# Patient Record
Sex: Female | Born: 1950 | Race: Black or African American | Hispanic: No | Marital: Single | Smoking: Never smoker
Health system: Southern US, Community
[De-identification: ages and names within clinical notes are randomized; demographics above are authoritative.]

## PROBLEM LIST (undated history)

## (undated) DIAGNOSIS — T7840XA Allergy, unspecified, initial encounter: Secondary | ICD-10-CM

## (undated) DIAGNOSIS — I10 Essential (primary) hypertension: Secondary | ICD-10-CM

## (undated) DIAGNOSIS — N879 Dysplasia of cervix uteri, unspecified: Secondary | ICD-10-CM

## (undated) DIAGNOSIS — M810 Age-related osteoporosis without current pathological fracture: Secondary | ICD-10-CM

## (undated) DIAGNOSIS — T783XXA Angioneurotic edema, initial encounter: Secondary | ICD-10-CM

## (undated) DIAGNOSIS — L309 Dermatitis, unspecified: Secondary | ICD-10-CM

## (undated) HISTORY — PX: TUBAL LIGATION: SHX77

## (undated) HISTORY — DX: Allergy, unspecified, initial encounter: T78.40XA

## (undated) HISTORY — PX: COLPOSCOPY: SHX161

## (undated) HISTORY — DX: Essential (primary) hypertension: I10

## (undated) HISTORY — DX: Dermatitis, unspecified: L30.9

## (undated) HISTORY — DX: Angioneurotic edema, initial encounter: T78.3XXA

## (undated) HISTORY — PX: GYNECOLOGIC CRYOSURGERY: SHX857

## (undated) HISTORY — DX: Dysplasia of cervix uteri, unspecified: N87.9

## (undated) HISTORY — PX: BREAST BIOPSY: SHX20

## (undated) HISTORY — DX: Age-related osteoporosis without current pathological fracture: M81.0

---

## 1999-06-01 ENCOUNTER — Other Ambulatory Visit: Admission: RE | Admit: 1999-06-01 | Discharge: 1999-06-01 | Payer: Self-pay | Admitting: Obstetrics and Gynecology

## 1999-08-17 ENCOUNTER — Other Ambulatory Visit: Admission: RE | Admit: 1999-08-17 | Discharge: 1999-08-17 | Payer: Self-pay | Admitting: Gastroenterology

## 1999-08-17 ENCOUNTER — Encounter (INDEPENDENT_AMBULATORY_CARE_PROVIDER_SITE_OTHER): Payer: Self-pay | Admitting: Specialist

## 1999-09-14 ENCOUNTER — Other Ambulatory Visit: Admission: RE | Admit: 1999-09-14 | Discharge: 1999-09-14 | Payer: Self-pay | Admitting: Urology

## 2000-05-27 ENCOUNTER — Other Ambulatory Visit: Admission: RE | Admit: 2000-05-27 | Discharge: 2000-05-27 | Payer: Self-pay | Admitting: Obstetrics and Gynecology

## 2001-05-29 ENCOUNTER — Other Ambulatory Visit: Admission: RE | Admit: 2001-05-29 | Discharge: 2001-05-29 | Payer: Self-pay | Admitting: Obstetrics and Gynecology

## 2002-03-08 ENCOUNTER — Emergency Department (HOSPITAL_COMMUNITY): Admission: EM | Admit: 2002-03-08 | Discharge: 2002-03-08 | Payer: Self-pay | Admitting: Emergency Medicine

## 2002-03-08 ENCOUNTER — Encounter: Payer: Self-pay | Admitting: Emergency Medicine

## 2002-03-15 ENCOUNTER — Encounter: Admission: RE | Admit: 2002-03-15 | Discharge: 2002-03-29 | Payer: Self-pay | Admitting: Family Medicine

## 2002-05-28 ENCOUNTER — Other Ambulatory Visit: Admission: RE | Admit: 2002-05-28 | Discharge: 2002-05-28 | Payer: Self-pay | Admitting: Obstetrics and Gynecology

## 2003-05-31 ENCOUNTER — Other Ambulatory Visit: Admission: RE | Admit: 2003-05-31 | Discharge: 2003-05-31 | Payer: Self-pay | Admitting: Obstetrics and Gynecology

## 2004-06-01 ENCOUNTER — Other Ambulatory Visit: Admission: RE | Admit: 2004-06-01 | Discharge: 2004-06-01 | Payer: Self-pay | Admitting: Obstetrics and Gynecology

## 2005-06-07 ENCOUNTER — Other Ambulatory Visit: Admission: RE | Admit: 2005-06-07 | Discharge: 2005-06-07 | Payer: Self-pay | Admitting: Obstetrics and Gynecology

## 2006-06-13 ENCOUNTER — Other Ambulatory Visit: Admission: RE | Admit: 2006-06-13 | Discharge: 2006-06-13 | Payer: Self-pay | Admitting: Obstetrics and Gynecology

## 2007-01-16 ENCOUNTER — Ambulatory Visit: Payer: Self-pay | Admitting: Gastroenterology

## 2007-01-29 ENCOUNTER — Ambulatory Visit: Payer: Self-pay | Admitting: Gastroenterology

## 2007-04-24 ENCOUNTER — Encounter: Admission: RE | Admit: 2007-04-24 | Discharge: 2007-04-24 | Payer: Self-pay | Admitting: Family Medicine

## 2007-06-19 ENCOUNTER — Other Ambulatory Visit: Admission: RE | Admit: 2007-06-19 | Discharge: 2007-06-19 | Payer: Self-pay | Admitting: Obstetrics and Gynecology

## 2008-07-01 ENCOUNTER — Other Ambulatory Visit: Admission: RE | Admit: 2008-07-01 | Discharge: 2008-07-01 | Payer: Self-pay | Admitting: Obstetrics and Gynecology

## 2008-07-01 ENCOUNTER — Ambulatory Visit: Payer: Self-pay | Admitting: Obstetrics and Gynecology

## 2008-07-01 ENCOUNTER — Encounter: Payer: Self-pay | Admitting: Obstetrics and Gynecology

## 2008-10-22 IMAGING — CR DG CHEST 2V
2 series · 2 of 2 positions shown · non-contrast
Comparison: none

CLINICAL DATA: Cough and congestion

CHEST - 2 VIEW:

[w chest pa]
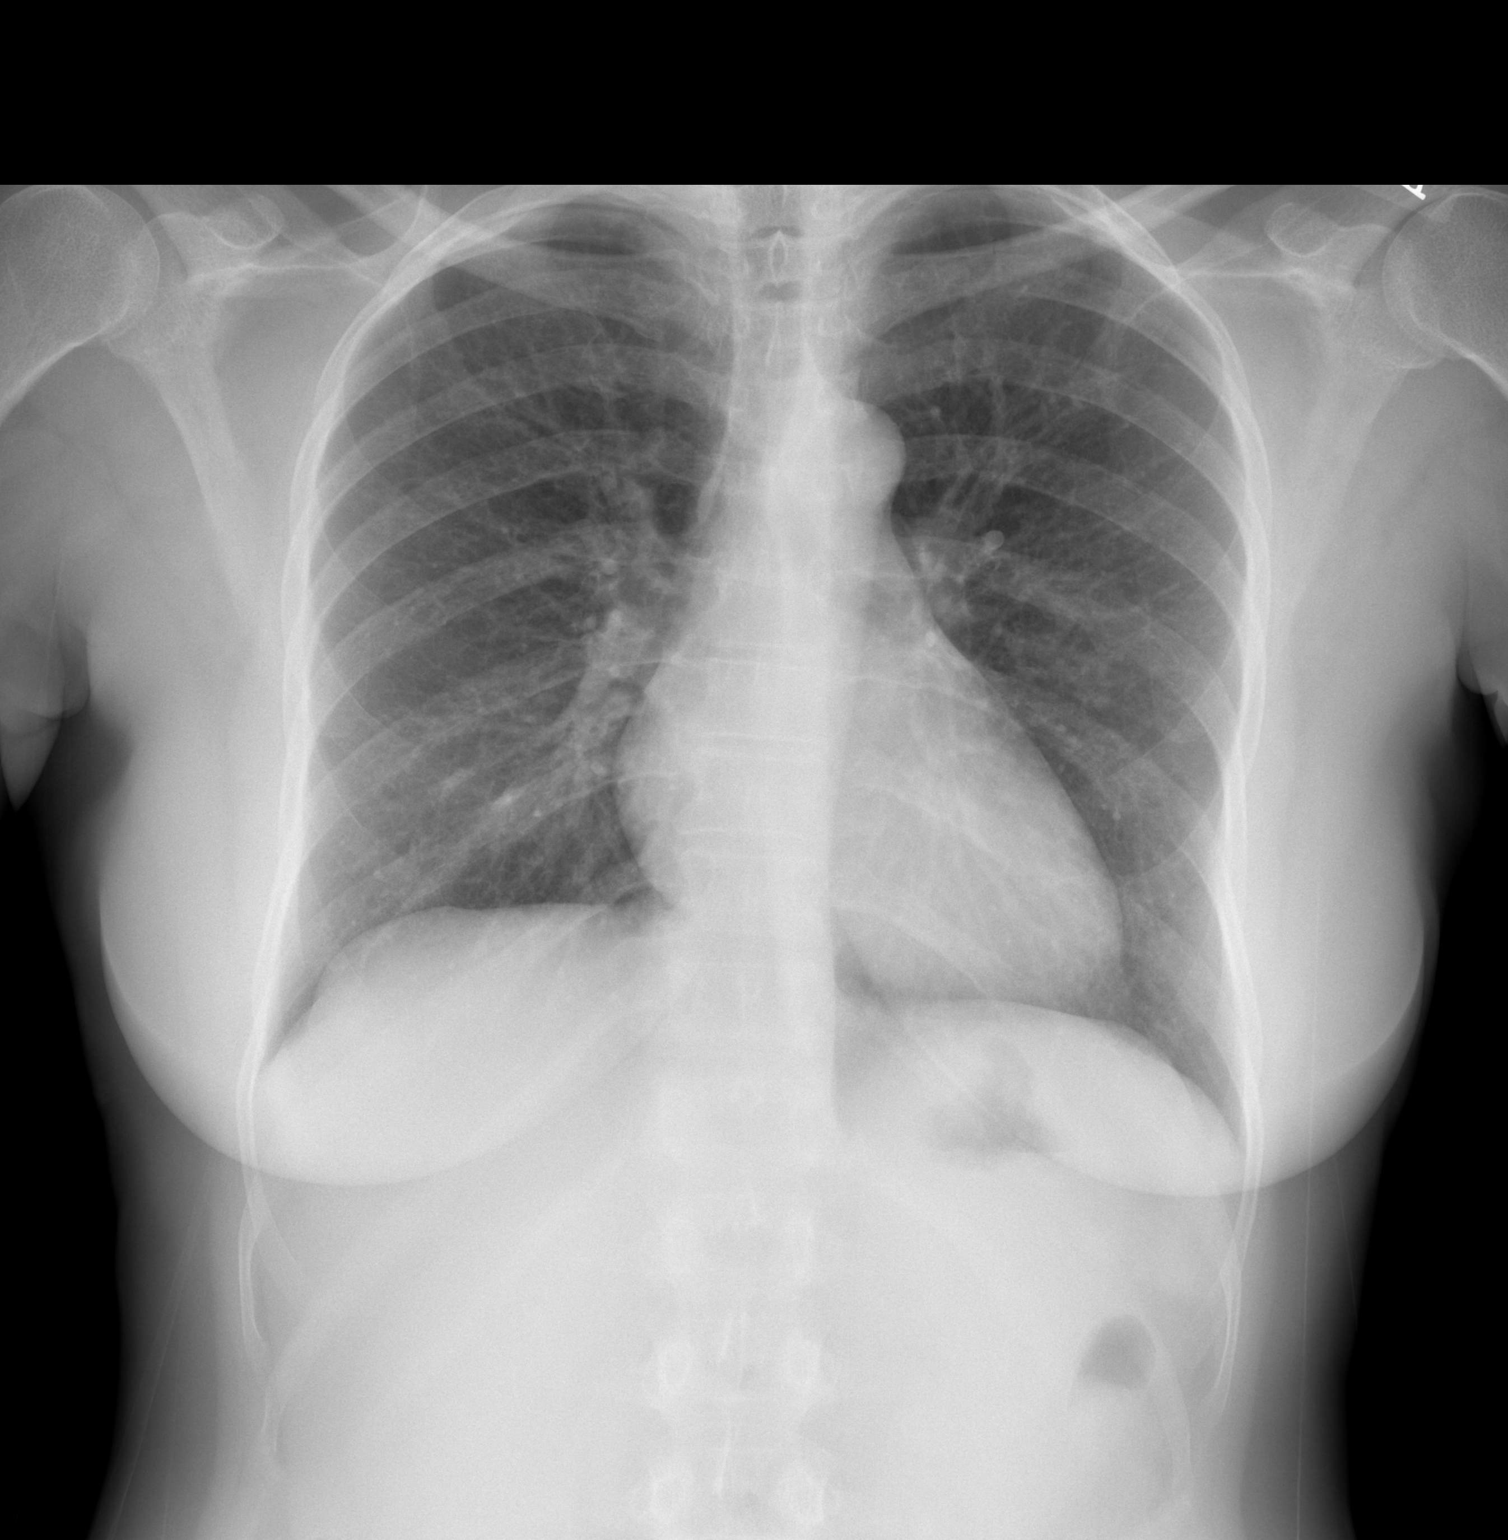

[w chest lat]
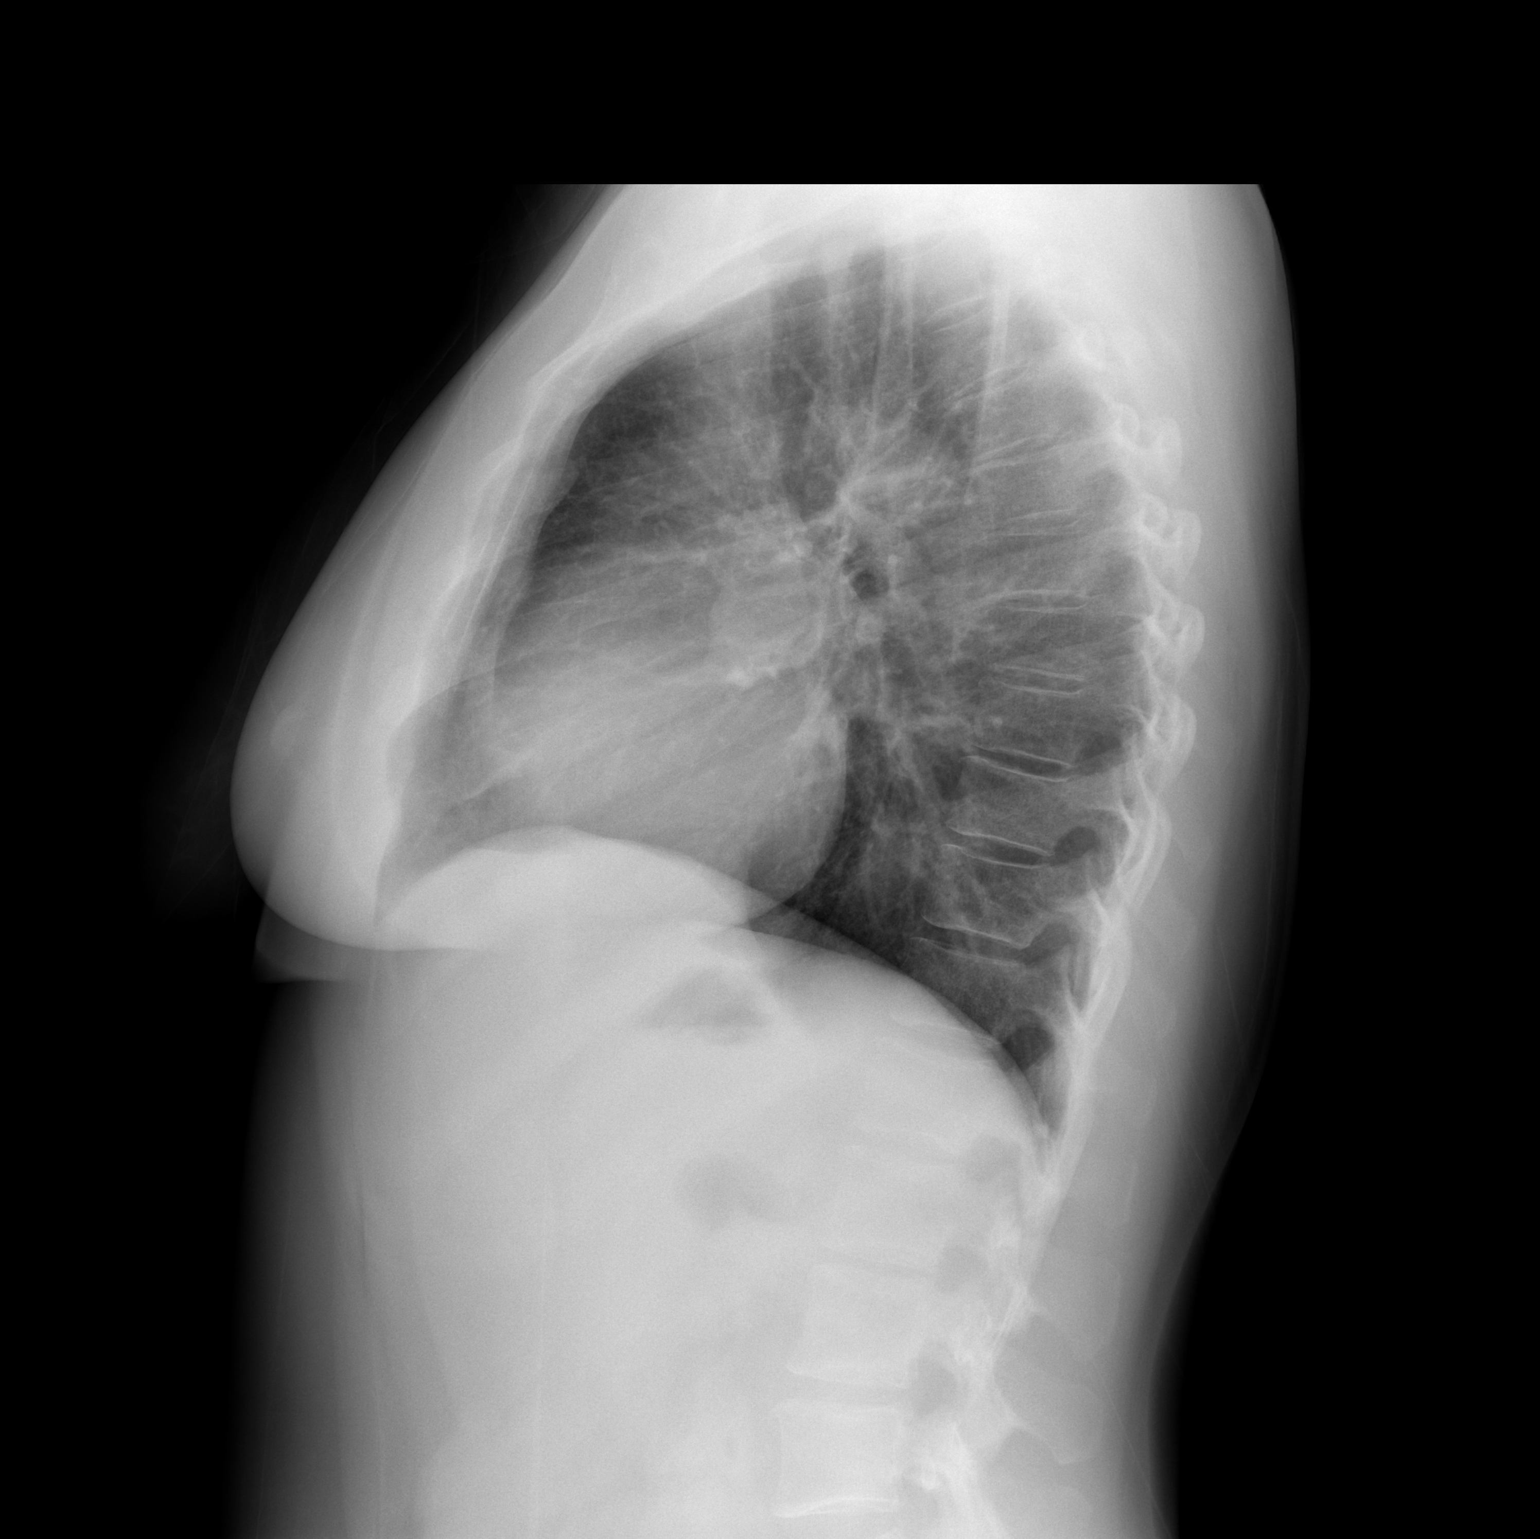

[2 of 2 positions shown; findings below may reference images not displayed]

FINDINGS: The heart size and mediastinal contours are within normal limits.

Both lungs are clear.  

The visualized skeletal structures are unremarkable.
IMPRESSION: No active cardiopulmonary disease.

## 2009-07-11 ENCOUNTER — Encounter: Payer: Self-pay | Admitting: Obstetrics and Gynecology

## 2009-07-11 ENCOUNTER — Other Ambulatory Visit: Admission: RE | Admit: 2009-07-11 | Discharge: 2009-07-11 | Payer: Self-pay | Admitting: Obstetrics and Gynecology

## 2009-07-11 ENCOUNTER — Ambulatory Visit: Payer: Self-pay | Admitting: Obstetrics and Gynecology

## 2010-06-29 ENCOUNTER — Ambulatory Visit: Payer: Self-pay | Admitting: Obstetrics and Gynecology

## 2010-06-29 ENCOUNTER — Other Ambulatory Visit: Admission: RE | Admit: 2010-06-29 | Discharge: 2010-06-29 | Payer: Self-pay | Admitting: Obstetrics and Gynecology

## 2011-01-15 NOTE — Assessment & Plan Note (Signed)
[facility]                                 ON-CALL NOTE   NAME:REIDPoetry, Cerro                        MRN:          [id number]  DATE:01/29/2007                            DOB:          [birth year]    TIME:  2020 hours.   Ms. Armenteros daughter-in-law called the answering service and I returned  her call. Ms. Monrreal had a colonoscopy with hot biopsy of 3 small polyps  today, this morning. This is the third time that she has had a  colonoscopy. Dr. Corinda Gubler performed the procedure. She has no pain. She  has passed gas okay. However, she has been nauseous when she tries to  eat and she has vomited soup that she ate. She got sweaty when she  vomited, but otherwise, she feels okay at this time. I advised her to  sip some clear liquids for a while, and gradually try to advance her  diet, but that if she did not feel better within a few hours, or was  still having problems, she was to call me back. There is no bleeding.  There is no fever and she is not tender in her abdomen when she palpates  it, and she says that she has absolutely no pain.   Perhaps she has had some post sedation problems. There does not appear  to be any sign of a perforation or postpolypectomy syndrome at this  point without fever or abdominal pain. Further plans pending clinical  course, again she knows to call me back and I said that if she had any  severe problems that she should just go to the emergency room.     Iva Boop, MD,FACG  Electronically Signed    CEG/MedQ  DD: 01/29/2007  DT: 01/30/2007  Job #: [id number]   cc:   Ulyess Mort, MD

## 2011-08-23 ENCOUNTER — Encounter: Payer: Self-pay | Admitting: Obstetrics and Gynecology

## 2011-09-20 ENCOUNTER — Ambulatory Visit (INDEPENDENT_AMBULATORY_CARE_PROVIDER_SITE_OTHER): Payer: BC Managed Care – PPO | Admitting: Obstetrics and Gynecology

## 2011-09-20 ENCOUNTER — Other Ambulatory Visit (HOSPITAL_COMMUNITY)
Admission: RE | Admit: 2011-09-20 | Discharge: 2011-09-20 | Disposition: A | Discharge status: Home / Self Care | Payer: BC Managed Care – PPO | Source: Ambulatory Visit | Attending: Obstetrics and Gynecology | Admitting: Obstetrics and Gynecology

## 2011-09-20 ENCOUNTER — Encounter: Payer: Self-pay | Admitting: Obstetrics and Gynecology

## 2011-09-20 VITALS — BP 120/76 | Ht 62.0 in | Wt 137.0 lb

## 2011-09-20 DIAGNOSIS — Z01419 Encounter for gynecological examination (general) (routine) without abnormal findings: Secondary | ICD-10-CM

## 2011-09-20 DIAGNOSIS — I10 Essential (primary) hypertension: Secondary | ICD-10-CM | POA: Insufficient documentation

## 2011-09-20 DIAGNOSIS — N879 Dysplasia of cervix uteri, unspecified: Secondary | ICD-10-CM | POA: Insufficient documentation

## 2011-09-20 LAB — URINALYSIS W MICROSCOPIC + REFLEX CULTURE
Bilirubin Urine: NEGATIVE
Specific Gravity, Urine: 1.02 (ref 1.005–1.030)
Urobilinogen, UA: 0.2 mg/dL (ref 0.0–1.0)

## 2011-09-20 NOTE — Progress Notes (Signed)
Patient came to see me today for her annual GYN exam. She continues to have hot flashes. She has tried both estrogen and SSRI and didn't like either. She is heard of something natural that  she was willing to try but didn't bring the name of the today. She is due for her mammogram and  her bone density. She does have osteopenia without an elevated FRAX risk. She takes calcium and vitamin D. She has had no fractures. She is having no vaginal bleeding or pelvic pain.  Physical examination: Rachel Duffy present. HEENT within normal limits. Neck: Thyroid not large. No masses. Supraclavicular nodes: not enlarged. Breasts: Examined in both sitting midline position. No skin changes and no masses. Abdomen: Soft no guarding rebound or masses or hernia. Pelvic: External: Within normal limits. BUS: Within normal limits. Vaginal:within normal limits. Good estrogen effect. No evidence of cystocele rectocele or enterocele. Cervix: clean. Uterus: Normal size and shape. Adnexa: No masses. Rectovaginal exam: Confirmatory and negative. Extremities: Within normal limits.  Assessment: #1. Menopausal symptoms #2. Osteopenia  Plan: Mammogram in bone density. Return fasting for lab work. Discussed Neurontin for hot flashes. Patient declined. She will get me the name of the medication that she would like to try.

## 2011-09-27 ENCOUNTER — Other Ambulatory Visit: Payer: BC Managed Care – PPO

## 2011-09-27 DIAGNOSIS — Z01419 Encounter for gynecological examination (general) (routine) without abnormal findings: Secondary | ICD-10-CM

## 2011-09-27 LAB — COMPREHENSIVE METABOLIC PANEL
ALT: 23 U/L (ref 0–35)
AST: 22 U/L (ref 0–37)
Albumin: 4.5 g/dL (ref 3.5–5.2)
CO2: 31 mEq/L (ref 19–32)
Calcium: 9.3 mg/dL (ref 8.4–10.5)
Chloride: 104 mEq/L (ref 96–112)
Creat: 0.68 mg/dL (ref 0.50–1.10)
Potassium: 3.7 mEq/L (ref 3.5–5.3)
Sodium: 143 mEq/L (ref 135–145)
Total Protein: 6.6 g/dL (ref 6.0–8.3)

## 2011-09-27 LAB — CBC WITH DIFFERENTIAL/PLATELET
Basophils Absolute: 0 10*3/uL (ref 0.0–0.1)
Eosinophils Absolute: 0.2 10*3/uL (ref 0.0–0.7)
Lymphs Abs: 2.9 10*3/uL (ref 0.7–4.0)
MCH: 32.7 pg (ref 26.0–34.0)
Neutrophils Relative %: 33 % — ABNORMAL LOW (ref 43–77)
Platelets: 254 10*3/uL (ref 150–400)
RBC: 4.41 MIL/uL (ref 3.87–5.11)
RDW: 14.3 % (ref 11.5–15.5)
WBC: 5.1 10*3/uL (ref 4.0–10.5)

## 2011-09-27 LAB — LIPID PANEL
LDL Cholesterol: 87 mg/dL (ref 0–99)
Triglycerides: 86 mg/dL (ref <150)
VLDL: 17 mg/dL (ref 0–40)

## 2011-09-27 LAB — HEMOGLOBIN A1C: Hgb A1c MFr Bld: 5.8 % — ABNORMAL HIGH (ref <5.7)

## 2011-10-02 ENCOUNTER — Other Ambulatory Visit: Payer: Self-pay | Admitting: *Deleted

## 2011-10-02 DIAGNOSIS — M8580 Other specified disorders of bone density and structure, unspecified site: Secondary | ICD-10-CM

## 2011-10-02 DIAGNOSIS — M898X9 Other specified disorders of bone, unspecified site: Secondary | ICD-10-CM

## 2011-10-11 ENCOUNTER — Encounter: Payer: Self-pay | Admitting: Obstetrics and Gynecology

## 2011-10-17 ENCOUNTER — Other Ambulatory Visit: Payer: Self-pay | Admitting: Obstetrics and Gynecology

## 2011-10-17 DIAGNOSIS — M8580 Other specified disorders of bone density and structure, unspecified site: Secondary | ICD-10-CM

## 2011-10-17 DIAGNOSIS — M898X9 Other specified disorders of bone, unspecified site: Secondary | ICD-10-CM

## 2011-11-12 ENCOUNTER — Encounter: Payer: Self-pay | Admitting: Gastroenterology

## 2011-11-13 ENCOUNTER — Encounter: Payer: Self-pay | Admitting: Gastroenterology

## 2012-01-16 ENCOUNTER — Encounter: Payer: BC Managed Care – PPO | Admitting: Gastroenterology

## 2012-01-31 ENCOUNTER — Encounter: Payer: Self-pay | Admitting: Gastroenterology

## 2012-01-31 ENCOUNTER — Ambulatory Visit (AMBULATORY_SURGERY_CENTER): Payer: BC Managed Care – PPO | Admitting: *Deleted

## 2012-01-31 VITALS — Ht 63.0 in | Wt 140.0 lb

## 2012-01-31 DIAGNOSIS — Z1211 Encounter for screening for malignant neoplasm of colon: Secondary | ICD-10-CM

## 2012-01-31 MED ORDER — PEG-KCL-NACL-NASULF-NA ASC-C 100 G PO SOLR: ORAL | Status: DC

## 2012-02-14 ENCOUNTER — Encounter: Payer: Self-pay | Admitting: Gastroenterology

## 2012-02-14 ENCOUNTER — Ambulatory Visit (AMBULATORY_SURGERY_CENTER): Payer: BC Managed Care – PPO | Admitting: Gastroenterology

## 2012-02-14 VITALS — BP 163/71 | HR 59 | Temp 97.1°F | Resp 16 | Ht 63.0 in | Wt 140.0 lb

## 2012-02-14 DIAGNOSIS — D126 Benign neoplasm of colon, unspecified: Secondary | ICD-10-CM

## 2012-02-14 DIAGNOSIS — Z1211 Encounter for screening for malignant neoplasm of colon: Secondary | ICD-10-CM

## 2012-02-14 DIAGNOSIS — Z800 Family history of malignant neoplasm of digestive organs: Secondary | ICD-10-CM

## 2012-02-14 MED ORDER — SODIUM CHLORIDE 0.9 % IV SOLN: 500.0000 mL | INTRAVENOUS | Status: DC

## 2012-02-14 NOTE — Patient Instructions (Addendum)
4 polyps removed and sent to pathology  Otherwise normal exam  YOU HAD AN ENDOSCOPIC PROCEDURE TODAY AT THE [facility]: Refer to the procedure report that was given to you for any specific questions about what was found during the examination.  If the procedure report does not answer your questions, please call your gastroenterologist to clarify.  If you requested that your care partner not be given the details of your procedure findings, then the procedure report has been included in a sealed envelope for you to review at your convenience later.  YOU SHOULD EXPECT: Some feelings of bloating in the abdomen. Passage of more gas than usual.  Walking can help get rid of the air that was put into your GI tract during the procedure and reduce the bloating. If you had a lower endoscopy (such as a colonoscopy or flexible sigmoidoscopy) you may notice spotting of blood in your stool or on the toilet paper. If you underwent a bowel prep for your procedure, then you may not have a normal bowel movement for a few days.  DIET: Your first meal following the procedure should be a light meal and then it is ok to progress to your normal diet.  A half-sandwich or bowl of soup is an example of a good first meal.  Heavy or fried foods are harder to digest and may make you feel nauseous or bloated.  Likewise meals heavy in dairy and vegetables can cause extra gas to form and this can also increase the bloating.  Drink plenty of fluids but you should avoid alcoholic beverages for 24 hours.  ACTIVITY: Your care partner should take you home directly after the procedure.  You should plan to take it easy, moving slowly for the rest of the day.  You can resume normal activity the day after the procedure however you should NOT DRIVE or use heavy machinery for 24 hours (because of the sedation medicines used during the test).    SYMPTOMS TO REPORT IMMEDIATELY: A gastroenterologist can be reached at any hour.   During normal business hours, 8:30 AM to 5:00 PM Monday through Friday, call [phone].  After hours and on weekends, please call the GI answering service at [phone] who will take a message and have the physician on call contact you.   Following lower endoscopy (colonoscopy or flexible sigmoidoscopy):  Excessive amounts of blood in the stool  Significant tenderness or worsening of abdominal pains  Swelling of the abdomen that is new, acute  Fever of 100F or higher  FOLLOW UP: If any biopsies were taken you will be contacted by phone or by letter within the next 1-3 weeks.  Call your gastroenterologist if you have not heard about the biopsies in 3 weeks.  Our staff will call the home number listed on your records the next business day following your procedure to check on you and address any questions or concerns that you may have at that time regarding the information given to you following your procedure. This is a courtesy call and so if there is no answer at the home number and we have not heard from you through the emergency physician on call, we will assume that you have returned to your regular daily activities without incident.  SIGNATURES/CONFIDENTIALITY: You and/or your care partner have signed paperwork which will be entered into your electronic medical record.  These signatures attest to the fact that that the information above on your After Visit Summary has  been reviewed and is understood.  Full responsibility of the confidentiality of this discharge information lies with you and/or your care-partner.  

## 2012-02-14 NOTE — Progress Notes (Signed)
Patient did not experience any of the following events: a burn prior to discharge; a fall within the facility; wrong site/side/patient/procedure/implant event; or a hospital transfer or hospital admission upon discharge from the facility. (G8907) Patient did not have preoperative order for IV antibiotic SSI prophylaxis. (G8918)  

## 2012-02-14 NOTE — Op Note (Addendum)
[facility] [address] [place], [place]  [address]  COLONOSCOPY PROCEDURE REPORT  PATIENT:  Rachel Duffy, Rachel Duffy  MR#:  [id number] BIRTHDATE:  May 28, 1951, 60 yrs. old  GENDER:  female ENDOSCOPIST:  Barbette Hair. Arlyce Dice, MD REF. BY:  Clyda Greener, M.D. PROCEDURE DATE:  02/14/2012 PROCEDURE:  Colonoscopy with snare polypectomy, Colon with cold biopsy polypectomy ASA CLASS:  Class I INDICATIONS:  Elevated Risk Screening, family history of colon cancer Mother MEDICATIONS:   MAC sedation, administered by CRNA propofol 180mg IV  DESCRIPTION OF PROCEDURE:   After the risks benefits and alternatives of the procedure were thoroughly explained, informed consent was obtained.  Digital rectal exam was performed and revealed no abnormalities.   The LB CF-H180AL [id number] endoscope was introduced through the anus and advanced to the cecum, which was identified by the ileocecal valve, without limitations.  The quality of the prep was excellent, using MoviPrep.  The instrument was then slowly withdrawn as the colon was fully examined. <<PROCEDUREIMAGES>>  FINDINGS:  There were multiple polyps identified and removed. Single cecal, 2 transverse and 1 descending colon diminutive polyps measuring about 2mm, removed with cold bx forceps. A 3mm transverse colon polyp was removed with cold polypectomy snare and cold biopsy (see image2, image3, image4, and image5).  This was otherwise a normal examination of the colon (see image1 and image7).   Retroflexed views in the rectum revealed no abnormalities.    The time to cecum =  1) 2.50  minutes. The scope was then withdrawn in  1) 10.50  minutes from the cecum and the procedure completed. COMPLICATIONS:  None ENDOSCOPIC IMPRESSION: 1) Polyps, multiple 2) Otherwise normal examination RECOMMENDATIONS: 1) If the polyp(s) removed today are proven to be adenomatous (pre-cancerous) polyps, you will need a repeat colonoscopy in 5 years. Otherwise you should  continue to follow colorectal cancer screening guidelines for "routine risk" patients with colonoscopy in 10 years. You will receive a letter within 1-2 weeks with the results of your biopsy as well as final recommendations. Please call my office if you have not received a letter after 3 weeks. REPEAT EXAM:  5 years in view of family history of colon cancer  ______________________________ Barbette Hair. Arlyce Dice, MD  CC:  n. REVISED:  02/14/2012 09:24 AM eSIGNED:   Barbette Hair. Loreta Blouch at 02/14/2012 09:24 AM  Larey Seat, [phone]

## 2012-02-17 ENCOUNTER — Telehealth: Payer: Self-pay

## 2012-02-17 NOTE — Telephone Encounter (Signed)
Left message on answering machine. 

## 2012-02-19 ENCOUNTER — Encounter: Payer: Self-pay | Admitting: Gastroenterology

## 2012-08-08 ENCOUNTER — Encounter (HOSPITAL_COMMUNITY): Payer: Self-pay

## 2012-08-08 ENCOUNTER — Emergency Department (HOSPITAL_COMMUNITY)
Admission: EM | Admit: 2012-08-08 | Discharge: 2012-08-08 | Disposition: A | Discharge status: Home / Self Care | Payer: Self-pay | Attending: Emergency Medicine | Admitting: Emergency Medicine

## 2012-08-08 ENCOUNTER — Emergency Department (HOSPITAL_COMMUNITY): Payer: Self-pay

## 2012-08-08 DIAGNOSIS — Z7982 Long term (current) use of aspirin: Secondary | ICD-10-CM | POA: Insufficient documentation

## 2012-08-08 DIAGNOSIS — I10 Essential (primary) hypertension: Secondary | ICD-10-CM | POA: Insufficient documentation

## 2012-08-08 DIAGNOSIS — Z8742 Personal history of other diseases of the female genital tract: Secondary | ICD-10-CM | POA: Insufficient documentation

## 2012-08-08 DIAGNOSIS — E876 Hypokalemia: Secondary | ICD-10-CM

## 2012-08-08 DIAGNOSIS — Z79899 Other long term (current) drug therapy: Secondary | ICD-10-CM | POA: Insufficient documentation

## 2012-08-08 LAB — BASIC METABOLIC PANEL WITH GFR
Chloride: 103 meq/L (ref 96–112)
GFR calc non Af Amer: >90 mL/min (ref >90)
Glucose, Bld: 114 mg/dL — ABNORMAL HIGH (ref 70–99)
Potassium: 3.1 meq/L — ABNORMAL LOW (ref 3.5–5.1)
Sodium: 141 meq/L (ref 135–145)

## 2012-08-08 LAB — BASIC METABOLIC PANEL
BUN: 11 mg/dL (ref 6–23)
CO2: 27 mEq/L (ref 19–32)
Calcium: 9.3 mg/dL (ref 8.4–10.5)
Creatinine, Ser: 0.66 mg/dL (ref 0.50–1.10)
GFR calc Af Amer: >90 mL/min (ref >90)

## 2012-08-08 LAB — CBC WITH DIFFERENTIAL/PLATELET
Basophils Absolute: 0 10*3/uL (ref 0.0–0.1)
Basophils Relative: 0 % (ref 0–1)
Eosinophils Absolute: 0.1 K/uL (ref 0.0–0.7)
Eosinophils Relative: 1 % (ref 0–5)
HCT: 38.8 % (ref 36.0–46.0)
Hemoglobin: 13.5 g/dL (ref 12.0–15.0)
Lymphocytes Relative: 28 % (ref 12–46)
Lymphs Abs: 1.6 K/uL (ref 0.7–4.0)
MCH: 31.3 pg (ref 26.0–34.0)
MCHC: 34.8 g/dL (ref 30.0–36.0)
MCV: 90 fL (ref 78.0–100.0)
Monocytes Absolute: 0.6 10*3/uL (ref 0.1–1.0)
Monocytes Relative: 9 % (ref 3–12)
Neutro Abs: 3.6 K/uL (ref 1.7–7.7)
Neutrophils Relative %: 61 % (ref 43–77)
Platelets: 232 K/uL (ref 150–400)
RBC: 4.31 MIL/uL (ref 3.87–5.11)
RDW: 13.5 % (ref 11.5–15.5)
WBC: 5.9 K/uL (ref 4.0–10.5)

## 2012-08-08 LAB — URINALYSIS, ROUTINE W REFLEX MICROSCOPIC
Bilirubin Urine: NEGATIVE
Glucose, UA: NEGATIVE mg/dL
Ketones, ur: NEGATIVE mg/dL
Leukocytes, UA: NEGATIVE
Nitrite: NEGATIVE
Protein, ur: NEGATIVE mg/dL
Specific Gravity, Urine: 1.018 (ref 1.005–1.030)
Urobilinogen, UA: 0.2 mg/dL (ref 0.0–1.0)
pH: 6.5 (ref 5.0–8.0)

## 2012-08-08 LAB — URINE MICROSCOPIC-ADD ON

## 2012-08-08 LAB — CK: Total CK: 112 U/L (ref 7–177)

## 2012-08-08 LAB — POCT I-STAT TROPONIN I: Troponin i, poc: 0.02 ng/mL (ref 0.00–0.08)

## 2012-08-08 MED ORDER — POTASSIUM CHLORIDE CRYS ER 20 MEQ PO TBCR: 20.0000 meq | EXTENDED_RELEASE_TABLET | Freq: Every day | ORAL | Status: DC

## 2012-08-08 MED ORDER — POTASSIUM CHLORIDE CRYS ER 20 MEQ PO TBCR
20.0000 meq | EXTENDED_RELEASE_TABLET | Freq: Once | ORAL | Status: AC
  Administered 2012-08-08: 20 meq via ORAL
  Filled 2012-08-08: qty 1

## 2012-08-08 NOTE — Discharge Instructions (Signed)

## 2012-08-08 NOTE — ED Notes (Signed)
The pt has no loss  Of control of bowel or bladder no tongue damage

## 2012-08-08 NOTE — ED Notes (Signed)
See the corrected bp

## 2012-08-08 NOTE — ED Provider Notes (Addendum)
History     CSN: [id number]  Arrival date & time 08/08/12  5784   First MD Initiated Contact with Patient 08/08/12 [phone]      Chief Complaint  Patient presents with  . Seizures    (Consider location/radiation/quality/duration/timing/severity/associated sxs/prior treatment) HPI Comments: History also obtained by Dr. Bruna Potter who is patient's client.  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  He notes that for some reason she had come to his room last night, he wasn't aware of it till later.  He noted shaking of left arm and later she had falled to floor and had decreased responsiveness and sonorous heavy breathing as though she were post ictal.  Pt denies any symptoms currently, no oral trauma, no HA, nausea, no focal numbness or weakness, denies urinary or bowel incontinence.  Pt has no sig PMH, no family h/o seizures.  Dr. Bruna Potter had called 911 and she recalls paramedics around her this AM.  She recalls eating dinner and going to bed last night.  Pt denies any drug use.  Her MAR shows she takes ASA and HCTZ so likely pt does have a h/o HTN although she denied to me.    Patient is a 61 y.o. female presenting with seizures. The history is provided by the patient and a relative.  Seizures  Pertinent negatives include no confusion and no chest pain.    Past Medical History  Diagnosis Date  . Hypertension   . Cervical dysplasia   . Allergy     Past Surgical History  Procedure Date  . Tubal ligation   . Breast biopsy     BENIGN  . Colposcopy   . Gynecologic cryosurgery     Family History  Problem Relation Age of Onset  . Hypertension Mother   . Osteoporosis Mother   . Colon cancer Mother   . Hypertension Sister   . Diabetes Sister   . Diabetes Brother   . Hypertension Maternal Uncle   . Colon cancer Maternal Uncle     History  Substance Use Topics  . Smoking status: Never Smoker   . Smokeless tobacco: Never Used  . Alcohol Use: 1.2 oz/week    2 Glasses of  wine per week    OB History    Grav Para Term Preterm Abortions TAB SAB Ect Mult Living   1 1 1       1       Review of Systems  Constitutional: Negative for fever and chills.  HENT: Negative for neck pain.   Respiratory: Negative for chest tightness and shortness of breath.   Cardiovascular: Negative for chest pain.  Gastrointestinal: Negative for abdominal pain.  Genitourinary:       Neg for incontinence  Musculoskeletal: Negative for back pain.  Neurological: Positive for seizures and syncope.  Psychiatric/Behavioral: Negative for confusion.  All other systems reviewed and are negative.    Allergies  Clarithromycin and Sulfa antibiotics  Home Medications   Current Outpatient Rx  Name  Route  Sig  Dispense  Refill  . ASPIRIN 81 MG PO TABS   Oral   Take 81 mg by mouth daily.           Marland Kitchen HYDROCHLOROTHIAZIDE 25 MG PO TABS   Oral   Take 25 mg by mouth daily.           . ADULT MULTIVITAMIN W/MINERALS CH   Oral   Take 1 tablet by mouth daily.         Marland Kitchen  POTASSIUM CHLORIDE CRYS ER 20 MEQ PO TBCR   Oral   Take 1 tablet (20 mEq total) by mouth daily.   7 tablet   0     BP 113/66  Pulse 80  Temp 98.3 F (36.8 C)  Resp 20  SpO2 100%  Physical Exam  Nursing note and vitals reviewed. Constitutional: She is oriented to person, place, and time. She appears well-developed and well-nourished. No distress.  HENT:  Head: Normocephalic and atraumatic.  Right Ear: External ear normal.  Left Ear: External ear normal.  Mouth/Throat: Oropharynx is clear and moist.  Eyes: Pupils are equal, round, and reactive to light. No scleral icterus.  Neck: Normal range of motion. Neck supple.  Cardiovascular: Normal rate and regular rhythm.   Pulmonary/Chest: Effort normal. No respiratory distress. She has no wheezes.  Abdominal: Soft. She exhibits no distension. There is no tenderness. There is no rebound and no guarding.  Musculoskeletal: Normal range of motion. She  exhibits no tenderness.  Neurological: She is alert and oriented to person, place, and time. No cranial nerve deficit. Coordination normal.  Skin: Skin is warm and dry. She is not diaphoretic.  Psychiatric: She has a normal mood and affect.    ED Course  Procedures (including critical care time)  Labs Reviewed  BASIC METABOLIC PANEL - Abnormal; Notable for the following:    Potassium 3.1 (*)     Glucose, Bld 114 (*)     All other components within normal limits  CBC WITH DIFFERENTIAL  CK  POCT I-STAT TROPONIN I  URINALYSIS, ROUTINE W REFLEX MICROSCOPIC  URINE MICROSCOPIC-ADD ON   Ct Head Wo Contrast  08/08/2012  *RADIOLOGY REPORT*  Clinical Data: Possible seizure.  CT HEAD WITHOUT CONTRAST  Technique:  Contiguous axial images were obtained from the base of the skull through the vertex without contrast.  Comparison: None.  Findings: There is no evidence of acute intracranial abnormality including infarct, hemorrhage, midline shift or abnormal extra- axial fluid collection.  There is a lesion along the right aspect of the mid falx measuring 0.8 cm transverse by 1.7 cm AP consistent with focal ossification.  There is no edema in association with this lesion.  Imaged paranasal sinuses and mastoid air cells are clear.  IMPRESSION:  1.  No acute finding. 2.  Small focal ossification along the falx is benign as described above.   Original Report Authenticated By: Holley Dexter, M.D.      1. Hypokalemia     ra sat is 100% and I interpret to be normal  8:20 AM K+ is slightly low at 3.1.  Likely not cause of her symptoms, will give some oral replacement here and I think pt can continue to treat as home with proper diet.  Pt is on HCTZ for BP control.   9:06 AM I reviewed head CT myself and reviewed radiologist interpretation.  Pt has ambulated, normal gait, no further episodes, maintained normal mentation.  Will d/c home and refer as I outlined above.  Based on normal CK, no oral trauma,  no incontinence, I doubt seizure activity.     ECG at time 0711 shows NSR at rate 85, normal axis, RSR` in lead V1, borderline flat T waves inferolaterally.  No old ECG's available.    MDM  Pt has no complaints now, no prior h/o seizure.  Dr. Bruna Potter gives history that pt could have had a partial seizure that generalized.  NO incontinence, oral trauma, so this is simply supposition.  Will get head CT and EKG for syncope.  Otherwise pt can follow up as outpt as required.  Pt has no PCP. Will refer to PCP and to [facility] provided no sig findings and no recurrent seizure occurs.          Gavin Pound. Oletta Lamas, MD 08/08/12 1610  Gavin Pound. Oletta Lamas, MD 08/08/12 [phone]

## 2012-08-08 NOTE — ED Notes (Signed)
The pt was brought in by [facility].  Pt alert in the room she does not know why she is here.  No one in the room with her.  She reports that she just woke up and ems was in the room with her.  She has not been ill and she has no pain anywhere.  Alert oriented skin warm and dry.  Iv per ems

## 2012-08-08 NOTE — ED Notes (Addendum)
Per EMS, family states pt was sitting on the edge of bed when had "seizure-like" activity. Upon EMS arrival, pt post-ictal and upon arrival to ED pt alert and oriented. Denies hx of seizures. Pt. Does not remember seizure or activities surrounding event.

## 2012-09-25 ENCOUNTER — Encounter: Payer: BC Managed Care – PPO | Admitting: Gynecology

## 2013-04-23 ENCOUNTER — Ambulatory Visit (INDEPENDENT_AMBULATORY_CARE_PROVIDER_SITE_OTHER): Payer: BC Managed Care – PPO | Admitting: Gynecology

## 2013-04-23 ENCOUNTER — Encounter: Payer: Self-pay | Admitting: Gynecology

## 2013-04-23 VITALS — BP 128/86 | Ht 62.25 in | Wt 135.0 lb

## 2013-04-23 DIAGNOSIS — Z1159 Encounter for screening for other viral diseases: Secondary | ICD-10-CM

## 2013-04-23 DIAGNOSIS — N951 Menopausal and female climacteric states: Secondary | ICD-10-CM

## 2013-04-23 DIAGNOSIS — Z01419 Encounter for gynecological examination (general) (routine) without abnormal findings: Secondary | ICD-10-CM

## 2013-04-23 DIAGNOSIS — Z23 Encounter for immunization: Secondary | ICD-10-CM

## 2013-04-23 MED ORDER — PAROXETINE MESYLATE 7.5 MG PO CAPS: 7.5000 mg | ORAL_CAPSULE | Freq: Every day | ORAL | Status: DC

## 2013-04-23 NOTE — Patient Instructions (Signed)
Shingles Vaccine What You Need to Know WHAT IS SHINGLES?  Shingles is a painful skin rash, often with blisters. It is also called Herpes Zoster or just Zoster.  A shingles rash usually appears on one side of the face or body and lasts from 2 to 4 weeks. Its main symptom is pain, which can be quite severe. Other symptoms of shingles can include fever, headache, chills, and upset stomach. Very rarely, a shingles infection can lead to pneumonia, hearing problems, blindness, brain inflammation (encephalitis), or death.  For about 1 person in 5, severe pain can continue even after the rash clears up. This is called post-herpetic neuralgia.  Shingles is caused by the Varicella Zoster virus. This is the same virus that causes chickenpox. Only someone who has had a case of chickenpox or rarely, has gotten chickenpox vaccine, can get shingles. The virus stays in your body. It can reappear many years later to cause a case of shingles.  You cannot catch shingles from another person with shingles. However, a person who has never had chickenpox (or chickenpox vaccine) could get chickenpox from someone with shingles. This is not very common.  Shingles is far more common in people 50 and older than in younger people. It is also more common in people whose immune systems are weakened because of a disease such as cancer or drugs such as steroids or chemotherapy.  At least 1 million people get shingles per year in the [place]. SHINGLES VACCINE  A vaccine for shingles was licensed in 2006. In clinical trials, the vaccine reduced the risk of shingles by 50%. It can also reduce the pain in people who still get shingles after being vaccinated.  A single dose of shingles vaccine is recommended for adults 60 years of age and older. SOME PEOPLE SHOULD NOT GET SHINGLES VACCINE OR SHOULD WAIT A person should not get shingles vaccine if he or she:  Has ever had a life-threatening allergic reaction to gelatin, the  antibiotic neomycin, or any other component of shingles vaccine. Tell your caregiver if you have any severe allergies.  Has a weakened immune system because of current:  AIDS or another disease that affects the immune system.  Treatment with drugs that affect the immune system, such as prolonged use of high-dose steroids.  Cancer treatment, such as radiation or chemotherapy.  Cancer affecting the bone marrow or lymphatic system, such as leukemia or lymphoma.  Is pregnant, or might be pregnant. Women should not become pregnant until at least 4 weeks after getting shingles vaccine. Someone with a minor illness, such as a cold, may be vaccinated. Anyone with a moderate or severe acute illness should usually wait until he or she recovers before getting the vaccine. This includes anyone with a temperature of 101.3 F (38 C) or higher. WHAT ARE THE RISKS FROM SHINGLES VACCINE?  A vaccine, like any medicine, could possibly cause serious problems, such as severe allergic reactions. However, the risk of a vaccine causing serious harm, or death, is extremely small.  No serious problems have been identified with shingles vaccine. Mild Problems  Redness, soreness, swelling, or itching at the site of the injection (about 1 person in 3).  Headache (about 1 person in 70). Like all vaccines, shingles vaccine is being closely monitored for unusual or severe problems. WHAT IF THERE IS A MODERATE OR SEVERE REACTION? What should I look for? Any unusual condition, such as a severe allergic reaction or a high fever. If a severe allergic reaction   occurred, it would be within a few minutes to an hour after the shot. Signs of a serious allergic reaction can include difficulty breathing, weakness, hoarseness or wheezing, a fast heartbeat, hives, dizziness, paleness, or swelling of the throat. What should I do?  Call your caregiver, or get the person to a caregiver right away.  Tell the caregiver what  happened, the date and time it happened, and when the vaccination was given.  Ask the caregiver to report the reaction by filing a Vaccine Adverse Event Reporting System (VAERS) form. Or, you can file this report through the VAERS web site at www.vaers.LAgents.no or by calling 1-484-130-4106. VAERS does not provide medical advice. HOW CAN I LEARN MORE?  Ask your caregiver. He or she can give you the vaccine package insert or suggest other sources of information.  Contact the Centers for Disease Control and Prevention (CDC):  Call 980-551-5533 (1-800-CDC-INFO).  Visit the CDC website at PicCapture.uy CDC Shingles Vaccine VIS (06/07/08) Document Released: 06/16/2006 Document Revised: 11/11/2011 Document Reviewed: 06/07/2008 ExitCare Patient Information 2014 [place], [place]. Tetanus, Diphtheria, Pertussis (Tdap) Vaccine What You Need to Know WHY GET VACCINATED? Tetanus, diphtheria and pertussis can be very serious diseases, even for adolescents and adults. Tdap vaccine can protect [place] from these diseases. TETANUS (Lockjaw) causes painful muscle tightening and stiffness, usually all over the body.  It can lead to tightening of muscles in the head and neck so you can't open your mouth, swallow, or sometimes even breathe. Tetanus kills about 1 out of 5 people who are infected. DIPHTHERIA can cause a thick coating to form in the back of the throat.  It can lead to breathing problems, paralysis, heart failure, and death. PERTUSSIS (Whooping Cough) causes severe coughing spells, which can cause difficulty breathing, vomiting and disturbed sleep.  It can also lead to weight loss, incontinence, and rib fractures. Up to 2 in 100 adolescents and 5 in 100 adults with pertussis are hospitalized or have complications, which could include pneumonia and death. These diseases are caused by bacteria. Diphtheria and pertussis are spread from person to person through coughing or sneezing. Tetanus enters  the body through cuts, scratches, or wounds. Before vaccines, the [place] saw as many as 200,000 cases a year of diphtheria and pertussis, and hundreds of cases of tetanus. Since vaccination began, tetanus and diphtheria have dropped by about 99% and pertussis by about 80%. TDAP VACCINE Tdap vaccine can protect adolescents and adults from tetanus, diphtheria, and pertussis. One dose of Tdap is routinely given at age 10 or 59. People who did not get Tdap at that age should get it as soon as possible. Tdap is especially important for health care professionals and anyone having close contact with a baby younger than 12 months. Pregnant women should get a dose of Tdap during every pregnancy, to protect the newborn from pertussis. Infants are most at risk for severe, life-threatening complications from pertussis. A similar vaccine, called Td, protects from tetanus and diphtheria, but not pertussis. A Td booster should be given every 10 years. Tdap may be given as one of these boosters if you have not already gotten a dose. Tdap may also be given after a severe cut or burn to prevent tetanus infection. Your doctor can give you more information. Tdap may safely be given at the same time as other vaccines. SOME PEOPLE SHOULD NOT GET THIS VACCINE  If you ever had a life-threatening allergic reaction after a dose of any tetanus, diphtheria, or pertussis containing  vaccine, OR if you have a severe allergy to any part of this vaccine, you should not get Tdap. Tell your doctor if you have any severe allergies.  If you had a coma, or long or multiple seizures within 7 days after a childhood dose of DTP or DTaP, you should not get Tdap, unless a cause other than the vaccine was found. You can still get Td.  Talk to your doctor if you:  have epilepsy or another nervous system problem,  had severe pain or swelling after any vaccine containing diphtheria, tetanus or pertussis,  ever had Guillain-Barr  Syndrome (GBS),  aren't feeling well on the day the shot is scheduled. RISKS OF A VACCINE REACTION With any medicine, including vaccines, there is a chance of side effects. These are usually mild and go away on their own, but serious reactions are also possible. Brief fainting spells can follow a vaccination, leading to injuries from falling. Sitting or lying down for about 15 minutes can help prevent these. Tell your doctor if you feel dizzy or light-headed, or have vision changes or ringing in the ears. Mild problems following Tdap (Did not interfere with activities)  Pain where the shot was given (about 3 in 4 adolescents or 2 in 3 adults)  Redness or swelling where the shot was given (about 1 person in 5)  Mild fever of at least 100.68F (up to about 1 in 25 adolescents or 1 in 100 adults)  Headache (about 3 or 4 people in 10)  Tiredness (about 1 person in 3 or 4)  Nausea, vomiting, diarrhea, stomach ache (up to 1 in 4 adolescents or 1 in 10 adults)  Chills, body aches, sore joints, rash, swollen glands (uncommon) Moderate problems following Tdap (Interfered with activities, but did not require medical attention)  Pain where the shot was given (about 1 in 5 adolescents or 1 in 100 adults)  Redness or swelling where the shot was given (up to about 1 in 16 adolescents or 1 in 25 adults)  Fever over 102F (about 1 in 100 adolescents or 1 in 250 adults)  Headache (about 3 in 20 adolescents or 1 in 10 adults)  Nausea, vomiting, diarrhea, stomach ache (up to 1 or 3 people in 100)  Swelling of the entire arm where the shot was given (up to about 3 in 100). Severe problems following Tdap (Unable to perform usual activities, required medical attention)  Swelling, severe pain, bleeding and redness in the arm where the shot was given (rare). A severe allergic reaction could occur after any vaccine (estimated less than 1 in a million doses). WHAT IF THERE IS A SERIOUS REACTION? What  should I look for?  Look for anything that concerns you, such as signs of a severe allergic reaction, very high fever, or behavior changes. Signs of a severe allergic reaction can include hives, swelling of the face and throat, difficulty breathing, a fast heartbeat, dizziness, and weakness. These would start a few minutes to a few hours after the vaccination. What should I do?  If you think it is a severe allergic reaction or other emergency that can't wait, call 9-1-1 or get the person to the nearest hospital. Otherwise, call your doctor.  Afterward, the reaction should be reported to the "Vaccine Adverse Event Reporting System" (VAERS). Your doctor might file this report, or you can do it yourself through the VAERS web site at www.vaers.SamedayNews.es, or by calling 913-238-6089. VAERS is only for reporting reactions. They do not give  medical advice.  THE NATIONAL VACCINE INJURY COMPENSATION PROGRAM The National Vaccine Injury Compensation Program (VICP) is a federal program that was created to compensate people who may have been injured by certain vaccines. Persons who believe they may have been injured by a vaccine can learn about the program and about filing a claim by calling 1-4188268976 or visiting the VICP website at SpiritualWord.at. HOW CAN I LEARN MORE?  Ask your doctor.  Call your local or state health department.  Contact the Centers for Disease Control and Prevention (CDC):  Call 4100448210 or visit CDC's website at PicCapture.uy. CDC Tdap Vaccine VIS (01/09/12) Document Released: 02/18/2012 Document Revised: 05/13/2012 Document Reviewed: 02/18/2012 ExitCare Patient Information 2014 [place], [place]. Menopause Menopause is the normal time of life when menstrual periods stop completely. Menopause is complete when you have missed 12 consecutive menstrual periods. It usually occurs between the ages of 55 to 110, with an average age of 5. Very rarely does a  woman develop menopause before 61 years old. At menopause, your ovaries stop producing the female hormones, estrogen and progesterone. This can cause undesirable symptoms and also affect your health. Sometimes the symptoms may occur 4 to 5 years before the menopause begins. There is no relationship between menopause and:  Oral contraceptives.  Number of children you had.  Race.  The age your menstrual periods started (menarche). Heavy smokers and very thin women may develop menopause earlier in life. CAUSES  The ovaries stop producing the female hormones estrogen and progesterone.  Other causes include:  Surgery to remove both ovaries.  The ovaries stop functioning for no known reason.  Tumors of the pituitary gland in the brain.  Medical disease that affects the ovaries and hormone production.  Radiation treatment to the abdomen or pelvis.  Chemotherapy that affects the ovaries. SYMPTOMS   Hot flashes.  Night sweats.  Decrease in sex drive.  Vaginal dryness and thinning of the vagina causing painful intercourse.  Dryness of the skin and developing wrinkles.  Headaches.  Tiredness.  Irritability.  Memory problems.  Weight gain.  Bladder infections.  Hair growth of the face and chest.  Infertility. More serious symptoms include:  Loss of bone (osteoporosis) causing breaks (fractures).  Depression.  Hardening and narrowing of the arteries (atherosclerosis) causing heart attacks and strokes. DIAGNOSIS   When the menstrual periods have stopped for 12 straight months.  Physical exam.  Hormone studies of the blood. TREATMENT  There are many treatment choices and nearly as many questions about them. The decisions to treat or not to treat menopausal changes is an individual choice made with your caregiver. Your caregiver can discuss the treatments with you. Together, you can decide which treatment will work best for you. Your treatment choices may include:    Hormone therapy (estorgen and progesterone).  Non-hormonal medications.  Treating the individual symptoms with medication (for example antidepressants for depression).  Herbal medications that may help specific symptoms.  Counseling by a psychiatrist or psychologist.  Group therapy.  Lifestyle changes including:  Eating healthy.  Regular exercise.  Limiting caffeine and alcohol.  Stress management and meditation.  No treatment. HOME CARE INSTRUCTIONS   Take the medication your caregiver gives you as directed.  Get plenty of sleep and rest.  Exercise regularly.  Eat a diet that contains calcium (good for the bones) and soy products (acts like estrogen hormone).  Avoid alcoholic beverages.  Do not smoke.  If you have hot flashes, dress in layers.  Take supplements, calcium and  vitamin D to strengthen bones.  You can use over-the-counter lubricants or moisturizers for vaginal dryness.  Group therapy is sometimes very helpful.  Acupuncture may be helpful in some cases. SEEK MEDICAL CARE IF:   You are not sure you are in menopause.  You are having menopausal symptoms and need advice and treatment.  You are still having menstrual periods after age 57.  You have pain with intercourse.  Menopause is complete (no menstrual period for 12 months) and you develop vaginal bleeding.  You need a referral to a specialist (gynecologist, psychiatrist or psychologist) for treatment. SEEK IMMEDIATE MEDICAL CARE IF:   You have severe depression.  You have excessive vaginal bleeding.  You fell and think you have a broken bone.  You have pain when you urinate.  You develop leg or chest pain.  You have a fast pounding heart beat (palpitations).  You have severe headaches.  You develop vision problems.  You feel a lump in your breast.  You have abdominal pain or severe indigestion. Document Released: 11/09/2003 Document Revised: 11/11/2011 Document Reviewed:  06/16/2008 [facility] Patient Information 2014 [place], [place].

## 2013-04-26 NOTE — Progress Notes (Signed)
Rachel Duffy Feb 09, 1951 [id number]   History:    62 y.o.  for annual gyn exam E. Flashes. She has tried both estrogen and SSRI in the past and did not like the effects. Patient with history of osteopenia with normal FRAX analysis in 2013. Last mammogram 2013 was normal but dense breast. She does have a history of right breast biopsy was benign in the past patient states she does her monthly breast exams. Review of patient's records indicate in 1980 she had cryotherapy of her cervix and followup Pap smears have been normal. Patient with past history of colon polyp. Last colonoscopy 2013. Mother with history of colon cancer.  Past medical history,surgical history, family history and social history were all reviewed and documented in the EPIC chart.  Gynecologic History No LMP recorded. Patient is postmenopausal. Contraception: post menopausal status Last Pap: 2013. Results were: normal Last mammogram: 2013. Results were: dense but normal  Obstetric History OB History  Gravida Para Term Preterm AB SAB TAB Ectopic Multiple Living  1 1 1       1     # Outcome Date GA Lbr Len/2nd Weight Sex Delivery Anes PTL Lv  1 TRM                ROS: A ROS was performed and pertinent positives and negatives are included in the history.  GENERAL: No fevers or chills. HEENT: No change in vision, no earache, sore throat or sinus congestion. NECK: No pain or stiffness. CARDIOVASCULAR: No chest pain or pressure. No palpitations. PULMONARY: No shortness of breath, cough or wheeze. GASTROINTESTINAL: No abdominal pain, nausea, vomiting or diarrhea, melena or bright red blood per rectum. GENITOURINARY: No urinary frequency, urgency, hesitancy or dysuria. MUSCULOSKELETAL: No joint or muscle pain, no back pain, no recent trauma. DERMATOLOGIC: No rash, no itching, no lesions. ENDOCRINE: No polyuria, polydipsia, no heat or cold intolerance. No recent change in weight. HEMATOLOGICAL: No anemia or easy bruising or bleeding.  NEUROLOGIC: No headache, seizures, numbness, tingling or weakness. PSYCHIATRIC: No depression, no loss of interest in normal activity or change in sleep pattern.     Exam: chaperone present  BP 128/86  Ht 5' 2.25" (1.581 m)  Wt 135 lb (61.236 kg)  BMI 24.5 kg/m2  Body mass index is 24.5 kg/(m^2).  General appearance : Well developed well nourished female. No acute distress HEENT: Neck supple, trachea midline, no carotid bruits, no thyroidmegaly Lungs: Clear to auscultation, no rhonchi or wheezes, or rib retractions  Heart: Regular rate and rhythm, no murmurs or gallops Breast:Examined in sitting and supine position were symmetrical in appearance, no palpable masses or tenderness,  no skin retraction, no nipple inversion, no nipple discharge, no skin discoloration, no axillary or supraclavicular lymphadenopathy Abdomen: no palpable masses or tenderness, no rebound or guarding Extremities: no edema or skin discoloration or tenderness  Pelvic:  Bartholin, Urethra, Skene Glands: Within normal limits             Vagina: No gross lesions or discharge  Cervix: No gross lesions or discharge  Uterus  anteverted, normal size, shape and consistency, non-tender and mobile  Adnexa  Without masses or tenderness  Anus and perineum  normal   Rectovaginal  normal sphincter tone without palpated masses or tenderness             Hemoccult Hemoccult card provided     Assessment/Plan:  62 y.o. female for annual exam who is menopausal but has been adamant of trying any hormone related  products. SSRIs didn't work for her. We did discuss that you low-dose SSRI Brisdelle 7.5 mg daily. The risks benefits and pros and cons were discussed. One-month sample was provided a prescription provided as well we'll see how this works for her. Patient did receive a Tdap vaccine today. Prescription for her scheduled shingles vaccine was provided. The following labs were ordered: CBC, fasting lipid profile, TSH,  comprehensive metabolic panel, and urinalysis. She was reminded cement in the office a nuchal cord her testing. Pap smear not done today Lines were discussed. Patient is followed in the past with Dr. Julien Girt, urologist in the past for microscopic hematuria.  New CDC guidelines is recommending patients be tested once in her lifetime for hepatitis C antibody who were born between [birth year] through 1964. This was discussed with the patient today and has agreed to be tested today.  Patient was reminded also to schedule her mammogram as well.    Ok Edwards MD, 9:54 AM 04/26/2013

## 2013-04-29 ENCOUNTER — Encounter: Payer: Self-pay | Admitting: Obstetrics and Gynecology

## 2013-04-30 ENCOUNTER — Other Ambulatory Visit: Payer: BC Managed Care – PPO

## 2013-04-30 ENCOUNTER — Other Ambulatory Visit: Payer: Self-pay | Admitting: Gynecology

## 2013-04-30 LAB — CBC WITH DIFFERENTIAL/PLATELET
Basophils Absolute: 0 10*3/uL (ref 0.0–0.1)
Basophils Relative: 0 % (ref 0–1)
HCT: 39.5 % (ref 36.0–46.0)
Lymphocytes Relative: 27 % (ref 12–46)
MCHC: 35.2 g/dL (ref 30.0–36.0)
Monocytes Absolute: 0.5 10*3/uL (ref 0.1–1.0)
Neutro Abs: 5.7 10*3/uL (ref 1.7–7.7)
Neutrophils Relative %: 67 % (ref 43–77)
Platelets: 285 10*3/uL (ref 150–400)
RDW: 14.1 % (ref 11.5–15.5)
WBC: 8.5 10*3/uL (ref 4.0–10.5)

## 2013-04-30 LAB — COMPREHENSIVE METABOLIC PANEL
ALT: 18 U/L (ref 0–35)
AST: 20 U/L (ref 0–37)
Albumin: 4.6 g/dL (ref 3.5–5.2)
Alkaline Phosphatase: 78 U/L (ref 39–117)
Calcium: 9.4 mg/dL (ref 8.4–10.5)
Chloride: 101 mEq/L (ref 96–112)
Potassium: 3.5 mEq/L (ref 3.5–5.3)
Sodium: 140 mEq/L (ref 135–145)

## 2013-04-30 LAB — LIPID PANEL
HDL: 60 mg/dL (ref >39)
LDL Cholesterol: 79 mg/dL (ref 0–99)

## 2013-04-30 LAB — TSH: TSH: 1.39 u[IU]/mL (ref 0.350–4.500)

## 2013-04-30 LAB — HEPATITIS C ANTIBODY: HCV Ab: NEGATIVE

## 2013-05-05 LAB — HEMOGLOBIN A1C: Mean Plasma Glucose: 114 mg/dL (ref <117)

## 2013-05-10 ENCOUNTER — Encounter: Payer: Self-pay | Admitting: Obstetrics and Gynecology

## 2013-05-10 ENCOUNTER — Encounter: Payer: Self-pay | Admitting: Gynecology

## 2013-05-17 ENCOUNTER — Encounter: Payer: Self-pay | Admitting: Gynecology

## 2014-02-06 IMAGING — CT CT HEAD W/O CM
1 series · 16 of 30 positions shown, 20 images · non-contrast
Comparison: None.

CLINICAL DATA: Possible seizure.

CT HEAD WITHOUT CONTRAST
TECHNIQUE: Contiguous axial images were obtained from the base of
the skull through the vertex without contrast.

[Series 2: head trauma 4.8 h37s · axial · 0.43mm/px · z∈[+8,+136]mm · 16 of 30 slices shown, 20 images]
[im 2/30  brain]
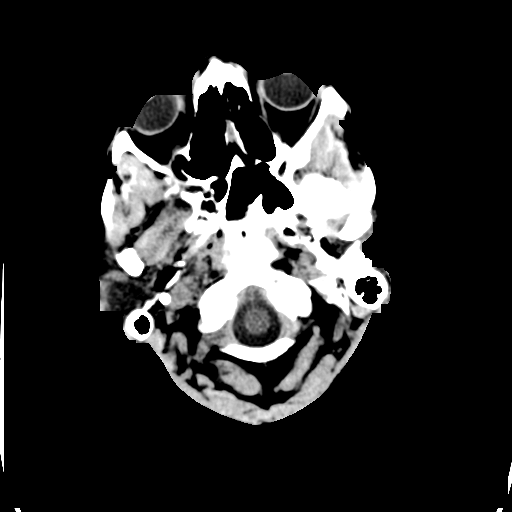
[im 2/30  bone]
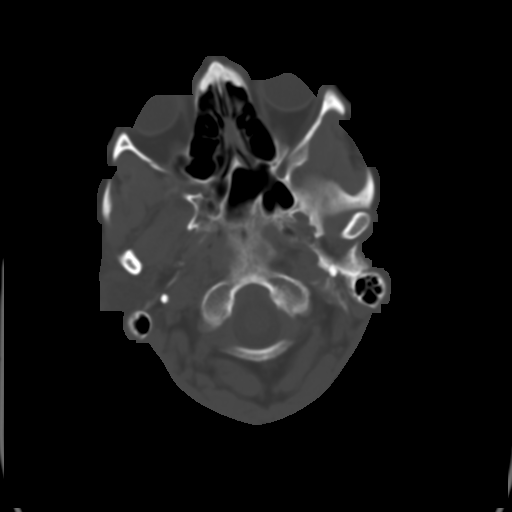
[im 4/30  brain]
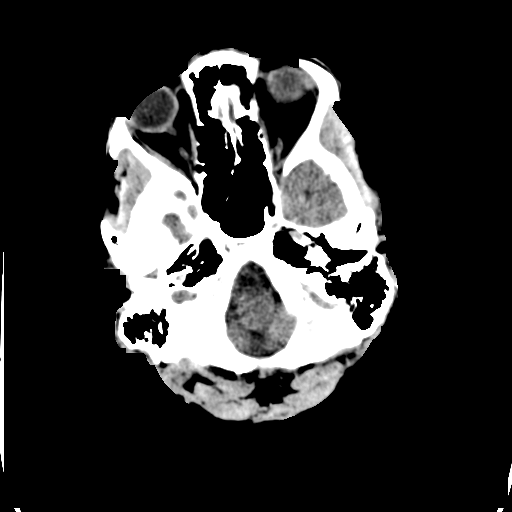
[im 6/30  brain]
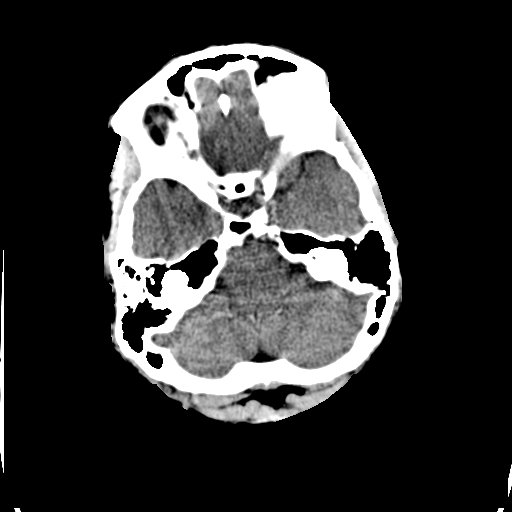
[im 8/30  brain]
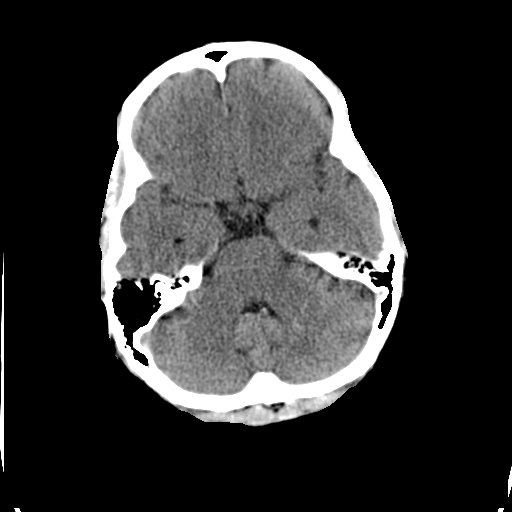
[im 9/30  brain]
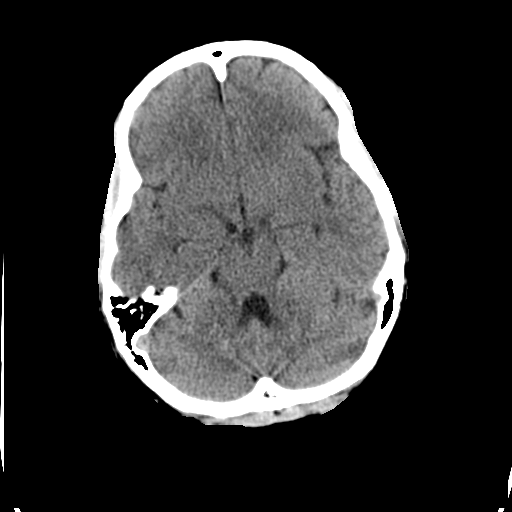
[im 9/30  bone]
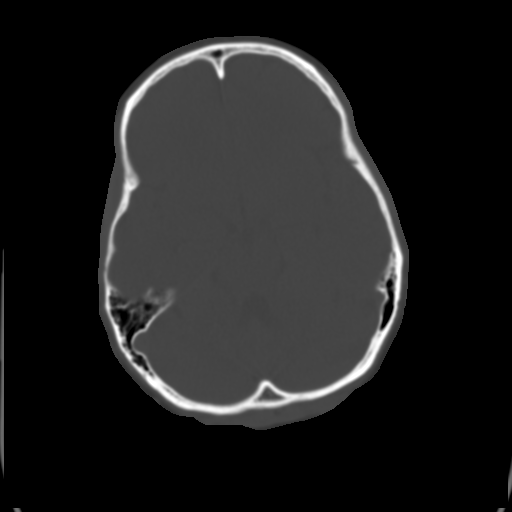
[im 11/30  brain]
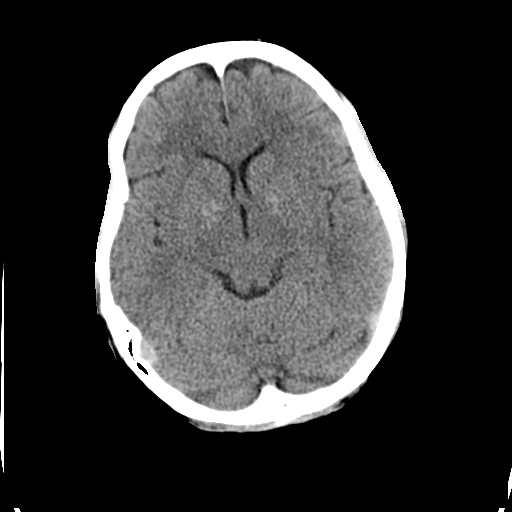
[im 13/30  brain]
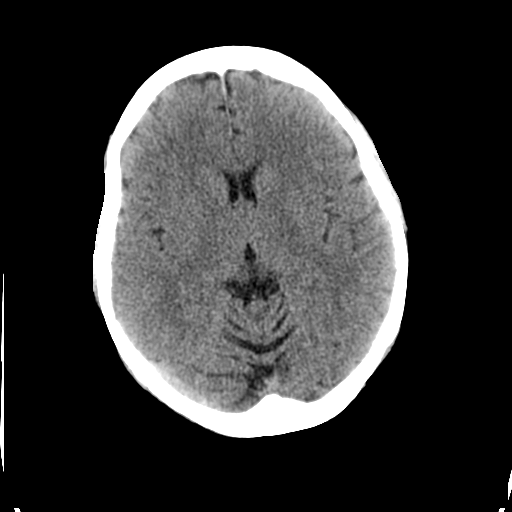
[im 15/30  brain]
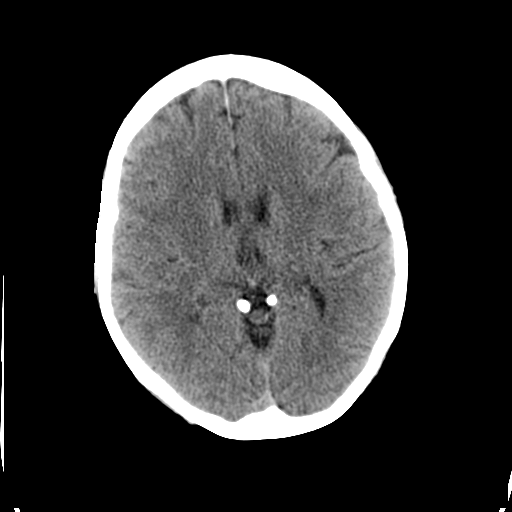
[im 16/30  brain]
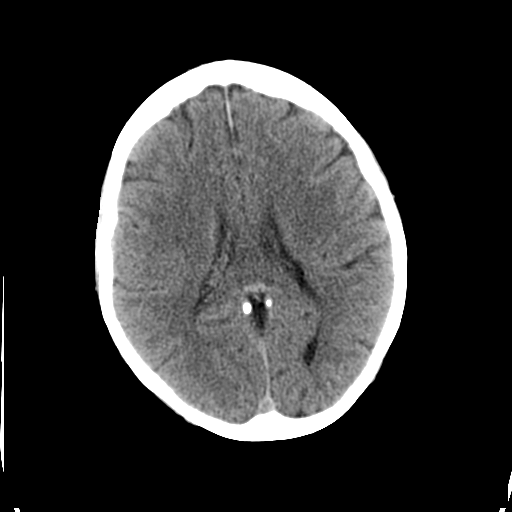
[im 16/30  bone]
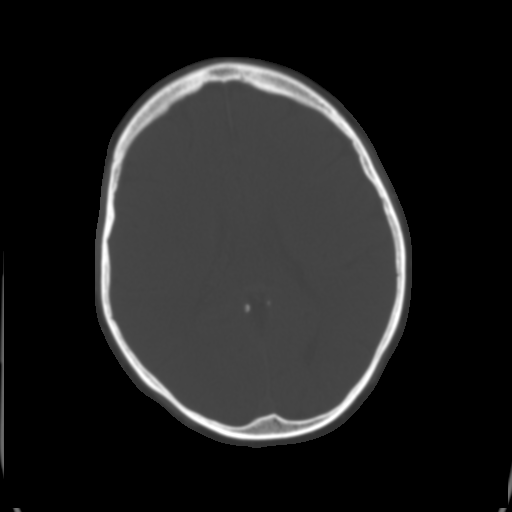
[im 18/30  brain]
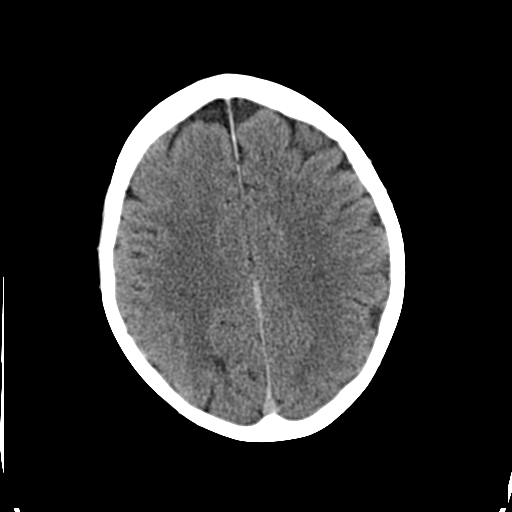
[im 20/30  brain]
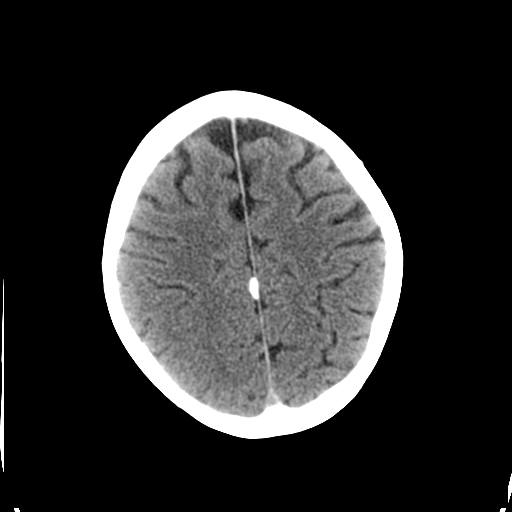
[im 22/30  brain]
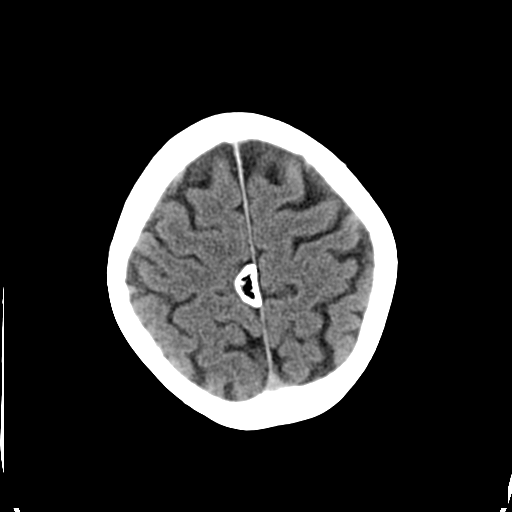
[im 23/30  brain]
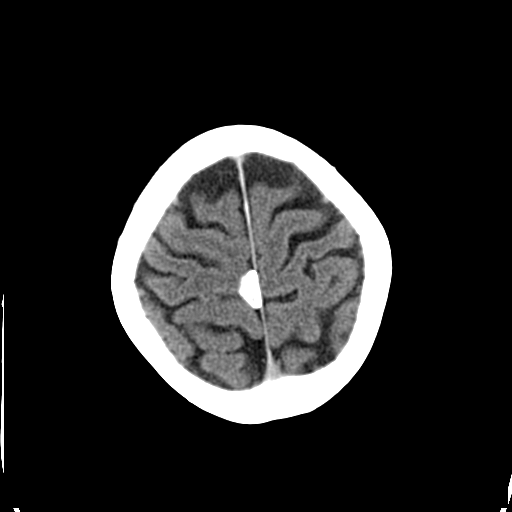
[im 23/30  bone]
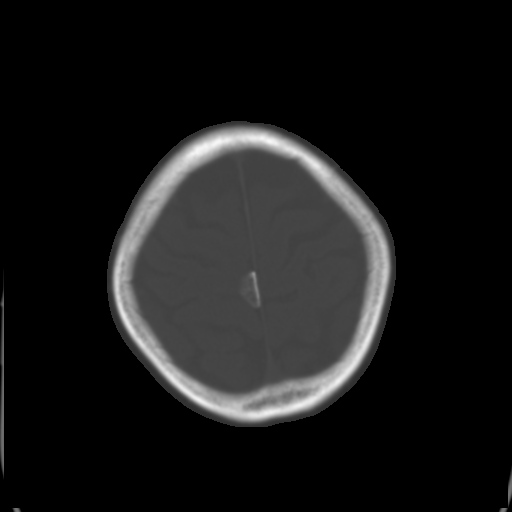
[im 25/30  brain]
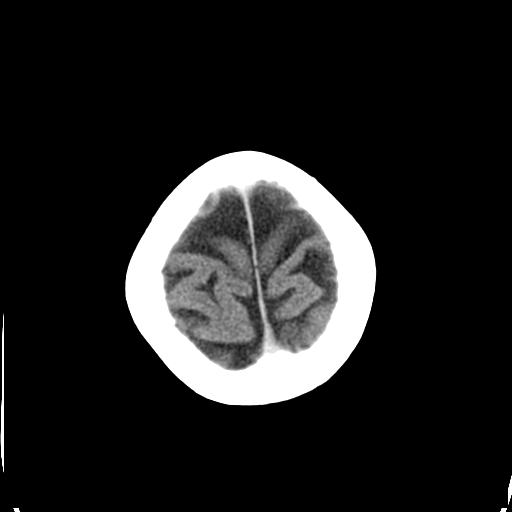
[im 27/30  brain]
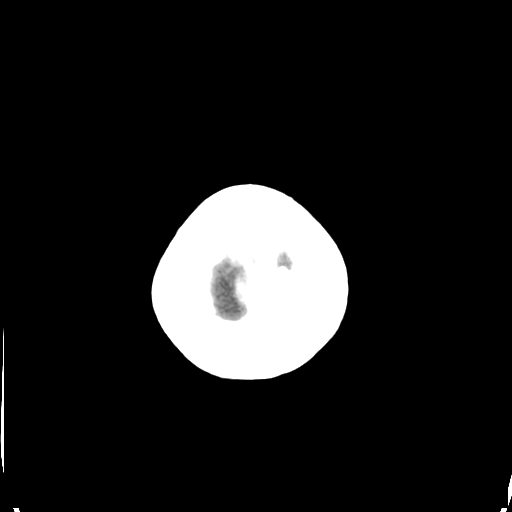
[im 29/30  brain]
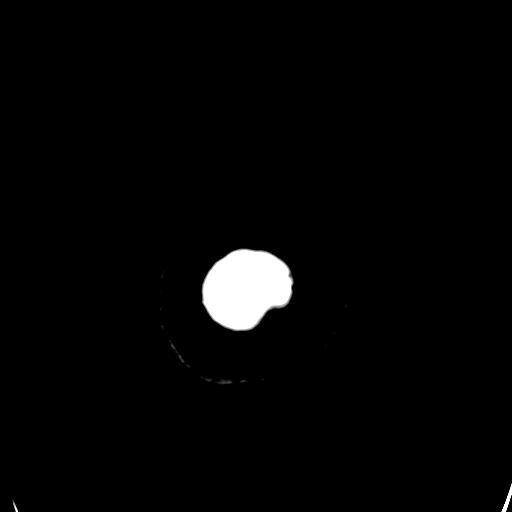

[16 of 30 positions shown; findings below may reference images not displayed]

FINDINGS: There is no evidence of acute intracranial abnormality
including infarct, hemorrhage, midline shift or abnormal extra-
axial fluid collection.  There is a lesion along the right aspect
of the mid falx measuring 0.8 cm transverse by 1.7 cm AP consistent
with focal ossification.  There is no edema in association with
this lesion.  Imaged paranasal sinuses and mastoid air cells are
clear.
IMPRESSION: 1.  No acute finding.
2.  Small focal ossification along the falx is benign as described
above.

## 2014-04-25 ENCOUNTER — Encounter: Payer: BC Managed Care – PPO | Admitting: Gynecology

## 2014-05-20 ENCOUNTER — Encounter: Payer: Self-pay | Admitting: Gynecology

## 2014-05-20 ENCOUNTER — Ambulatory Visit (INDEPENDENT_AMBULATORY_CARE_PROVIDER_SITE_OTHER): Payer: 59 | Admitting: Gynecology

## 2014-05-20 VITALS — BP 132/80 | Ht 62.5 in | Wt 138.0 lb

## 2014-05-20 DIAGNOSIS — Z833 Family history of diabetes mellitus: Secondary | ICD-10-CM

## 2014-05-20 DIAGNOSIS — Z87410 Personal history of cervical dysplasia: Secondary | ICD-10-CM

## 2014-05-20 DIAGNOSIS — Z780 Asymptomatic menopausal state: Secondary | ICD-10-CM

## 2014-05-20 DIAGNOSIS — Z01419 Encounter for gynecological examination (general) (routine) without abnormal findings: Secondary | ICD-10-CM

## 2014-05-20 DIAGNOSIS — N951 Menopausal and female climacteric states: Secondary | ICD-10-CM

## 2014-05-20 LAB — CBC WITH DIFFERENTIAL/PLATELET
Basophils Absolute: 0.1 10*3/uL (ref 0.0–0.1)
Basophils Relative: 1 % (ref 0–1)
EOS ABS: 0.2 10*3/uL (ref 0.0–0.7)
Eosinophils Relative: 3 % (ref 0–5)
HCT: 41.2 % (ref 36.0–46.0)
HEMOGLOBIN: 14 g/dL (ref 12.0–15.0)
LYMPHS ABS: 2.7 10*3/uL (ref 0.7–4.0)
Lymphocytes Relative: 48 % — ABNORMAL HIGH (ref 12–46)
MCH: 31.3 pg (ref 26.0–34.0)
MCHC: 34 g/dL (ref 30.0–36.0)
MCV: 92.2 fL (ref 78.0–100.0)
MONOS PCT: 9 % (ref 3–12)
Monocytes Absolute: 0.5 10*3/uL (ref 0.1–1.0)
NEUTROS PCT: 39 % — AB (ref 43–77)
Neutro Abs: 2.2 10*3/uL (ref 1.7–7.7)
Platelets: 263 10*3/uL (ref 150–400)
RBC: 4.47 MIL/uL (ref 3.87–5.11)
RDW: 14.4 % (ref 11.5–15.5)
WBC: 5.7 10*3/uL (ref 4.0–10.5)

## 2014-05-20 LAB — COMPREHENSIVE METABOLIC PANEL
ALBUMIN: 4.4 g/dL (ref 3.5–5.2)
ALT: 19 U/L (ref 0–35)
AST: 22 U/L (ref 0–37)
Alkaline Phosphatase: 72 U/L (ref 39–117)
BUN: 10 mg/dL (ref 6–23)
CALCIUM: 9.5 mg/dL (ref 8.4–10.5)
CHLORIDE: 103 meq/L (ref 96–112)
CO2: 29 mEq/L (ref 19–32)
Creat: 0.71 mg/dL (ref 0.50–1.10)
Glucose, Bld: 85 mg/dL (ref 70–99)
POTASSIUM: 3.4 meq/L — AB (ref 3.5–5.3)
SODIUM: 142 meq/L (ref 135–145)
TOTAL PROTEIN: 6.9 g/dL (ref 6.0–8.3)
Total Bilirubin: 0.5 mg/dL (ref 0.2–1.2)

## 2014-05-20 LAB — LIPID PANEL
Cholesterol: 137 mg/dL (ref 0–200)
HDL: 53 mg/dL (ref >39)
LDL Cholesterol: 69 mg/dL (ref 0–99)
Total CHOL/HDL Ratio: 2.6 Ratio
Triglycerides: 75 mg/dL (ref <150)
VLDL: 15 mg/dL (ref 0–40)

## 2014-05-20 LAB — HEMOGLOBIN A1C
HEMOGLOBIN A1C: 5.8 % — AB (ref <5.7)
MEAN PLASMA GLUCOSE: 120 mg/dL — AB (ref <117)

## 2014-05-20 NOTE — Progress Notes (Signed)
Rachel Duffy 05/01/1951 [id number]   History:    63 y.o.  for annual gyn exam with no major complaints today. The patient on no hormone replacement therapy.Patient with history of osteopenia with normal FRAX analysis in 2013. Last mammogram 2013 was normal but dense breast. She does have a history of right breast biopsy was benign in the past patient states she does her monthly breast exams. Review of patient's records indicate in 1980 she had cryotherapy of her cervix and followup Pap smears have been normal. Patient with past history of colon polyp. Last colonoscopy 2013. Mother with history of colon cancer. Patient not interested in vaccine but interested initially vaccine   Past medical history,surgical history, family history and social history were all reviewed and documented in the EPIC chart.  Gynecologic History No LMP recorded. Patient is postmenopausal. Contraception: post menopausal status Last Pap: 2014. Results were: normal Last mammogram: 2014. Results were: Normal but dense  Obstetric History OB History  Gravida Para Term Preterm AB SAB TAB Ectopic Multiple Living  1 1 1       1     # Outcome Date GA Lbr Len/2nd Weight Sex Delivery Anes PTL Lv  1 TRM                ROS: A ROS was performed and pertinent positives and negatives are included in the history.  GENERAL: No fevers or chills. HEENT: No change in vision, no earache, sore throat or sinus congestion. NECK: No pain or stiffness. CARDIOVASCULAR: No chest pain or pressure. No palpitations. PULMONARY: No shortness of breath, cough or wheeze. GASTROINTESTINAL: No abdominal pain, nausea, vomiting or diarrhea, melena or bright red blood per rectum. GENITOURINARY: No urinary frequency, urgency, hesitancy or dysuria. MUSCULOSKELETAL: No joint or muscle pain, no back pain, no recent trauma. DERMATOLOGIC: No rash, no itching, no lesions. ENDOCRINE: No polyuria, polydipsia, no heat or cold intolerance. No recent change in  weight. HEMATOLOGICAL: No anemia or easy bruising or bleeding. NEUROLOGIC: No headache, seizures, numbness, tingling or weakness. PSYCHIATRIC: No depression, no loss of interest in normal activity or change in sleep pattern.     Exam: chaperone present  BP 132/80  Ht 5' 2.5" (1.588 m)  Wt 138 lb (62.596 kg)  BMI 24.82 kg/m2  Body mass index is 24.82 kg/(m^2).  General appearance : Well developed well nourished female. No acute distress HEENT: Neck supple, trachea midline, no carotid bruits, no thyroidmegaly Lungs: Clear to auscultation, no rhonchi or wheezes, or rib retractions  Heart: Regular rate and rhythm, no murmurs or gallops Breast:Examined in sitting and supine position were symmetrical in appearance, no palpable masses or tenderness,  no skin retraction, no nipple inversion, no nipple discharge, no skin discoloration, no axillary or supraclavicular lymphadenopathy Abdomen: no palpable masses or tenderness, no rebound or guarding Extremities: no edema or skin discoloration or tenderness  Pelvic:  Bartholin, Urethra, Skene Glands: Within normal limits             Vagina: No gross lesions or discharge, atrophic changes  Cervix: No gross lesions or discharge  Uterus  anteverted, normal size, shape and consistency, non-tender and mobile  Adnexa  Without masses or tenderness  Anus and perineum  normal   Rectovaginal  normal sphincter tone without palpated masses or tenderness             Hemoccult cards provided     Assessment/Plan:  63 y.o. female for annual exam who is not interested in the  flu vaccine today. A prescription was provided for her to go to her neighborhood pharmacy to get her shingles vaccine. We discussed the importance of calcium and vitamin D in regular exercise for osteoporosis prevention and to schedule her bone density study. We also discussed importance of monthly self breast examination and she is to schedule her mammogram which is due as well. The  following labs were ordered today : CBC, comprehensive metabolic panel, Lipitor 4, TSH, and urinalysis. Last year she was treated for hepatitis C and was negative. She has history of hypertension and is on HCTZ and this made a appointment to see me PCP which is going to be Dr. Jeanann Lewandowsky.  Note: This dictation was prepared with  Dragon/digital dictation along withSmart phrase technology. Any transcriptional errors that result from this process are unintentional.   Terrance Mass MD, 10:03 AM 62/18/2015

## 2014-05-20 NOTE — Patient Instructions (Signed)
Shingles Vaccine What You Need to Know WHAT IS SHINGLES?  Shingles is a painful skin rash, often with blisters. It is also called Herpes Zoster or just Zoster.  A shingles rash usually appears on one side of the face or body and lasts from 2 to 4 weeks. Its main symptom is pain, which can be quite severe. Other symptoms of shingles can include fever, headache, chills, and upset stomach. Very rarely, a shingles infection can lead to pneumonia, hearing problems, blindness, brain inflammation (encephalitis), or death.  For about 1 person in 5, severe pain can continue even after the rash clears up. This is called post-herpetic neuralgia.  Shingles is caused by the Varicella Zoster virus. This is the same virus that causes chickenpox. Only someone who has had a case of chickenpox or rarely, has gotten chickenpox vaccine, can get shingles. The virus stays in your body. It can reappear many years later to cause a case of shingles.  You cannot catch shingles from another person with shingles. However, a person who has never had chickenpox (or chickenpox vaccine) could get chickenpox from someone with shingles. This is not very common.  Shingles is far more common in people 50 and older than in younger people. It is also more common in people whose immune systems are weakened because of a disease such as cancer or drugs such as steroids or chemotherapy.  At least 1 million people get shingles per year in the [place]. SHINGLES VACCINE  A vaccine for shingles was licensed in 2006. In clinical trials, the vaccine reduced the risk of shingles by 50%. It can also reduce the pain in people who still get shingles after being vaccinated.  A single dose of shingles vaccine is recommended for adults 60 years of age and older. SOME PEOPLE SHOULD NOT GET SHINGLES VACCINE OR SHOULD WAIT A person should not get shingles vaccine if he or she:  Has ever had a life-threatening allergic reaction to gelatin, the  antibiotic neomycin, or any other component of shingles vaccine. Tell your caregiver if you have any severe allergies.  Has a weakened immune system because of current:  AIDS or another disease that affects the immune system.  Treatment with drugs that affect the immune system, such as prolonged use of high-dose steroids.  Cancer treatment, such as radiation or chemotherapy.  Cancer affecting the bone marrow or lymphatic system, such as leukemia or lymphoma.  Is pregnant, or might be pregnant. Women should not become pregnant until at least 4 weeks after getting shingles vaccine. Someone with a minor illness, such as a cold, may be vaccinated. Anyone with a moderate or severe acute illness should usually wait until he or she recovers before getting the vaccine. This includes anyone with a temperature of 101.3 F (38 C) or higher. WHAT ARE THE RISKS FROM SHINGLES VACCINE?  A vaccine, like any medicine, could possibly cause serious problems, such as severe allergic reactions. However, the risk of a vaccine causing serious harm, or death, is extremely small.  No serious problems have been identified with shingles vaccine. Mild Problems  Redness, soreness, swelling, or itching at the site of the injection (about 1 person in 3).  Headache (about 1 person in 70). Like all vaccines, shingles vaccine is being closely monitored for unusual or severe problems. WHAT IF THERE IS A MODERATE OR SEVERE REACTION? What should I look for? Any unusual condition, such as a severe allergic reaction or a high fever. If a severe allergic reaction   occurred, it would be within a few minutes to an hour after the shot. Signs of a serious allergic reaction can include difficulty breathing, weakness, hoarseness or wheezing, a fast heartbeat, hives, dizziness, paleness, or swelling of the throat. What should I do?  Call your caregiver, or get the person to a caregiver right away.  Tell the caregiver what  happened, the date and time it happened, and when the vaccination was given.  Ask the caregiver to report the reaction by filing a Vaccine Adverse Event Reporting System (VAERS) form. Or, you can file this report through the VAERS web site at www.vaers.SamedayNews.es or by calling 289-351-8327. VAERS does not provide medical advice. HOW CAN I LEARN MORE?  Ask your caregiver. He or she can give you the vaccine package insert or suggest other sources of information.  Contact the Centers for Disease Control and Prevention (CDC):  Call (225)666-9186 (1-800-CDC-INFO).  Visit the CDC website at http://hunter.com/ CDC Shingles Vaccine VIS (06/07/08) Document Released: 06/16/2006 Document Revised: 11/11/2011 Document Reviewed: 12/09/2012  [facility] Patient Information 2015 [place]. This information is not intended to replace advice given to you by your health care provider. Make sure you discuss any questions you have with your health care provider. Influenza Virus Vaccine injection (Fluarix) What is this medicine? INFLUENZA VIRUS VACCINE (in floo EN zuh VAHY ruhs vak SEEN) helps to reduce the risk of getting influenza also known as the flu. This medicine may be used for other purposes; ask your health care provider or pharmacist if you have questions. COMMON BRAND NAME(S): Fluarix, Fluzone What should I tell my health care provider before I take this medicine? They need to know if you have any of these conditions: -bleeding disorder like hemophilia -fever or infection -Guillain-Barre syndrome or other neurological problems -immune system problems -infection with the human immunodeficiency virus (HIV) or AIDS -low blood platelet counts -multiple sclerosis -an unusual or allergic reaction to influenza virus vaccine, eggs, chicken proteins, latex, gentamicin, other medicines, foods, dyes or preservatives -pregnant or trying to get pregnant -breast-feeding How should I use this  medicine? This vaccine is for injection into a muscle. It is given by a health care professional. A copy of Vaccine Information Statements will be given before each vaccination. Read this sheet carefully each time. The sheet may change frequently. Talk to your pediatrician regarding the use of this medicine in children. Special care may be needed. Overdosage: If you think you have taken too much of this medicine contact a poison control center or emergency room at once. NOTE: This medicine is only for you. Do not share this medicine with others. What if I miss a dose? This does not apply. What may interact with this medicine? -chemotherapy or radiation therapy -medicines that lower your immune system like etanercept, anakinra, infliximab, and adalimumab -medicines that treat or prevent blood clots like warfarin -phenytoin -steroid medicines like prednisone or cortisone -theophylline -vaccines This list may not describe all possible interactions. Give your health care provider a list of all the medicines, herbs, non-prescription drugs, or dietary supplements you use. Also tell them if you smoke, drink alcohol, or use illegal drugs. Some items may interact with your medicine. What should I watch for while using this medicine? Report any side effects that do not go away within 3 days to your doctor or health care professional. Call your health care provider if any unusual symptoms occur within 6 weeks of receiving this vaccine. You may still catch the flu, but the illness is  not usually as bad. You cannot get the flu from the vaccine. The vaccine will not protect against colds or other illnesses that may cause fever. The vaccine is needed every year. What side effects may I notice from receiving this medicine? Side effects that you should report to your doctor or health care professional as soon as possible: -allergic reactions like skin rash, itching or hives, swelling of the face, lips, or  tongue Side effects that usually do not require medical attention (report to your doctor or health care professional if they continue or are bothersome): -fever -headache -muscle aches and pains -pain, tenderness, redness, or swelling at site where injected -weak or tired This list may not describe all possible side effects. Call your doctor for medical advice about side effects. You may report side effects to FDA at 1-800-FDA-1088. Where should I keep my medicine? This vaccine is only given in a clinic, pharmacy, doctor's office, or other health care setting and will not be stored at home. NOTE: This sheet is a summary. It may not cover all possible information. If you have questions about this medicine, talk to your doctor, pharmacist, or health care provider.  2015, [employer] Standard. (2008-03-16 09:30:40)

## 2014-05-21 LAB — URINALYSIS W MICROSCOPIC + REFLEX CULTURE
Bilirubin Urine: NEGATIVE
Casts: NONE SEEN
Crystals: NONE SEEN
Glucose, UA: NEGATIVE mg/dL
Hgb urine dipstick: NEGATIVE
Ketones, ur: NEGATIVE mg/dL
Leukocytes, UA: NEGATIVE
Nitrite: NEGATIVE
Protein, ur: NEGATIVE mg/dL
RBC / HPF: >50 RBC/hpf — AB (ref <3)
Specific Gravity, Urine: 1.029 (ref 1.005–1.030)
Urobilinogen, UA: 0.2 mg/dL (ref 0.0–1.0)
pH: 7.5 (ref 5.0–8.0)

## 2014-05-21 LAB — TSH: TSH: 1.964 u[IU]/mL (ref 0.350–4.500)

## 2014-05-22 LAB — URINE CULTURE

## 2014-05-31 ENCOUNTER — Encounter: Payer: Self-pay | Admitting: Gynecology

## 2014-06-03 ENCOUNTER — Encounter: Payer: Self-pay | Admitting: Gynecology

## 2014-06-06 ENCOUNTER — Other Ambulatory Visit: Payer: Self-pay | Admitting: Gynecology

## 2014-06-06 DIAGNOSIS — R7309 Other abnormal glucose: Secondary | ICD-10-CM

## 2014-07-04 ENCOUNTER — Encounter: Payer: Self-pay | Admitting: Gynecology

## 2015-02-10 ENCOUNTER — Other Ambulatory Visit: Payer: 59

## 2015-02-10 DIAGNOSIS — R7309 Other abnormal glucose: Secondary | ICD-10-CM

## 2015-02-10 LAB — HEMOGLOBIN A1C
Hgb A1c MFr Bld: 5.8 % — ABNORMAL HIGH (ref <5.7)
Mean Plasma Glucose: 120 mg/dL — ABNORMAL HIGH (ref <117)

## 2015-02-21 ENCOUNTER — Other Ambulatory Visit: Payer: Self-pay | Admitting: *Deleted

## 2015-02-21 DIAGNOSIS — R7309 Other abnormal glucose: Secondary | ICD-10-CM

## 2015-02-24 ENCOUNTER — Other Ambulatory Visit: Payer: 59

## 2015-04-04 ENCOUNTER — Encounter: Payer: Self-pay | Admitting: Gastroenterology

## 2015-05-26 ENCOUNTER — Encounter: Payer: 59 | Admitting: Gynecology

## 2015-06-02 ENCOUNTER — Other Ambulatory Visit (HOSPITAL_COMMUNITY)
Admission: RE | Admit: 2015-06-02 | Discharge: 2015-06-02 | Disposition: A | Discharge status: Home / Self Care | Payer: 59 | Source: Ambulatory Visit | Attending: Gynecology | Admitting: Gynecology

## 2015-06-02 ENCOUNTER — Ambulatory Visit (INDEPENDENT_AMBULATORY_CARE_PROVIDER_SITE_OTHER): Payer: 59 | Admitting: Gynecology

## 2015-06-02 ENCOUNTER — Encounter: Payer: Self-pay | Admitting: Gynecology

## 2015-06-02 ENCOUNTER — Encounter: Payer: 59 | Admitting: Gynecology

## 2015-06-02 VITALS — BP 128/76 | Ht 62.5 in | Wt 137.0 lb

## 2015-06-02 DIAGNOSIS — M8580 Other specified disorders of bone density and structure, unspecified site: Secondary | ICD-10-CM | POA: Diagnosis not present

## 2015-06-02 DIAGNOSIS — Z01419 Encounter for gynecological examination (general) (routine) without abnormal findings: Secondary | ICD-10-CM

## 2015-06-02 DIAGNOSIS — N951 Menopausal and female climacteric states: Secondary | ICD-10-CM

## 2015-06-02 DIAGNOSIS — Z1151 Encounter for screening for human papillomavirus (HPV): Secondary | ICD-10-CM | POA: Diagnosis present

## 2015-06-02 LAB — CBC WITH DIFFERENTIAL/PLATELET
Basophils Absolute: 0 10*3/uL (ref 0.0–0.1)
Basophils Relative: 0 % (ref 0–1)
EOS PCT: 2 % (ref 0–5)
Eosinophils Absolute: 0.1 10*3/uL (ref 0.0–0.7)
HEMATOCRIT: 41.6 % (ref 36.0–46.0)
Hemoglobin: 14.4 g/dL (ref 12.0–15.0)
LYMPHS PCT: 40 % (ref 12–46)
Lymphs Abs: 2.6 10*3/uL (ref 0.7–4.0)
MCH: 31.6 pg (ref 26.0–34.0)
MCHC: 34.6 g/dL (ref 30.0–36.0)
MCV: 91.2 fL (ref 78.0–100.0)
MPV: 10.4 fL (ref 8.6–12.4)
Monocytes Absolute: 0.5 10*3/uL (ref 0.1–1.0)
Monocytes Relative: 8 % (ref 3–12)
Neutro Abs: 3.3 10*3/uL (ref 1.7–7.7)
Neutrophils Relative %: 50 % (ref 43–77)
PLATELETS: 279 10*3/uL (ref 150–400)
RBC: 4.56 MIL/uL (ref 3.87–5.11)
RDW: 13.9 % (ref 11.5–15.5)
WBC: 6.6 10*3/uL (ref 4.0–10.5)

## 2015-06-02 LAB — COMPREHENSIVE METABOLIC PANEL
ALT: 20 U/L (ref 6–29)
AST: 22 U/L (ref 10–35)
Albumin: 4.5 g/dL (ref 3.6–5.1)
Alkaline Phosphatase: 74 U/L (ref 33–130)
BUN: 11 mg/dL (ref 7–25)
CALCIUM: 9.4 mg/dL (ref 8.6–10.4)
CO2: 29 mmol/L (ref 20–31)
Chloride: 103 mmol/L (ref 98–110)
Creat: 0.64 mg/dL (ref 0.50–0.99)
GLUCOSE: 88 mg/dL (ref 65–99)
Potassium: 3.5 mmol/L (ref 3.5–5.3)
Sodium: 143 mmol/L (ref 135–146)
Total Bilirubin: 0.5 mg/dL (ref 0.2–1.2)
Total Protein: 7.2 g/dL (ref 6.1–8.1)

## 2015-06-02 LAB — LIPID PANEL
Cholesterol: 141 mg/dL (ref 125–200)
HDL: 51 mg/dL (ref >=46)
LDL CALC: 77 mg/dL (ref <130)
Total CHOL/HDL Ratio: 2.8 Ratio (ref <=5.0)
Triglycerides: 66 mg/dL (ref <150)
VLDL: 13 mg/dL (ref <30)

## 2015-06-02 LAB — TSH: TSH: 1.398 u[IU]/mL (ref 0.350–4.500)

## 2015-06-02 NOTE — Patient Instructions (Signed)
Peppermint Oil

## 2015-06-02 NOTE — Progress Notes (Signed)
JERILEE SPACE 03-Jun-1951 [id number]   History:    64 y.o.  for annual gyn exam with only complaint is still she suffers from hot flashes. Last year she was prescribed Brisdelle but took it for short time did not like the side effects. Patient with past history of osteopenia last bone density study 2015 lowest T score -1.2 at the [facility]. Patient is fasting today for her blood work.She does have a history of right breast biopsy was benign in the past patient states she does her monthly breast exams. Review of patient's records indicate in 1980 she had cryotherapy of her cervix and followup Pap smears have been normal. Patient with past history of colon polyp. Last colonoscopy 2013. Mother with history of colon cancer. Patient not interested in flu vaccine.  Past medical history,surgical history, family history and social history were all reviewed and documented in the EPIC chart.  Gynecologic History No LMP recorded. Patient is postmenopausal. Contraception: post menopausal status Last Pap: 2013. Results were: normal Last mammogram: 2015. Results were: normal  Obstetric History OB History  Gravida Para Term Preterm AB SAB TAB Ectopic Multiple Living  1 1 1       1     # Outcome Date GA Lbr Len/2nd Weight Sex Delivery Anes PTL Lv  1 Term                ROS: A ROS was performed and pertinent positives and negatives are included in the history.  GENERAL: No fevers or chills. HEENT: No change in vision, no earache, sore throat or sinus congestion. NECK: No pain or stiffness. CARDIOVASCULAR: No chest pain or pressure. No palpitations. PULMONARY: No shortness of breath, cough or wheeze. GASTROINTESTINAL: No abdominal pain, nausea, vomiting or diarrhea, melena or bright red blood per rectum. GENITOURINARY: No urinary frequency, urgency, hesitancy or dysuria. MUSCULOSKELETAL: No joint or muscle pain, no back pain, no recent trauma. DERMATOLOGIC: No rash, no itching, no lesions. ENDOCRINE: No  polyuria, polydipsia, no heat or cold intolerance. No recent change in weight. HEMATOLOGICAL: No anemia or easy bruising or bleeding. NEUROLOGIC: No headache, seizures, numbness, tingling or weakness. PSYCHIATRIC: No depression, no loss of interest in normal activity or change in sleep pattern.     Exam: chaperone present  BP 128/76 mmHg  Ht 5' 2.5" (1.588 m)  Wt 137 lb (62.143 kg)  BMI 24.64 kg/m2  Body mass index is 24.64 kg/(m^2).  General appearance : Well developed well nourished female. No acute distress HEENT: Eyes: no retinal hemorrhage or exudates,  Neck supple, trachea midline, no carotid bruits, no thyroidmegaly Lungs: Clear to auscultation, no rhonchi or wheezes, or rib retractions  Heart: Regular rate and rhythm, no murmurs or gallops Breast:Examined in sitting and supine position were symmetrical in appearance, no palpable masses or tenderness,  no skin retraction, no nipple inversion, no nipple discharge, no skin discoloration, no axillary or supraclavicular lymphadenopathy Abdomen: no palpable masses or tenderness, no rebound or guarding Extremities: no edema or skin discoloration or tenderness  Pelvic:  Bartholin, Urethra, Skene Glands: Within normal limits             Vagina: No gross lesions or discharge, Atrophic changes  Cervix: No gross lesions or discharge  Uterus  anteverted, normal size, shape and consistency, non-tender and mobile  Adnexa  Without masses or tenderness  Anus and perineum  normal   Rectovaginal  normal sphincter tone without palpated masses or tenderness  Hemoccult cards will be provided     Assessment/Plan:  64 y.o. female for annual exam with strong family history of colon cancer in her family last colonoscopy benign polyps were removed in 2013 her next colonoscopy is in 2018. Patient refused the flu vaccine today. The following screening blood work was ordered today: Comprehensive metabolic panel, fasting lipid profile, TSH, CBC,  and urinalysis. Pap smear was done today. We discussed importance of calcium vitamin D and weightbearing exercises for osteoporosis prevention. Patient scheduled for mammogram in October. She may use peppermint oil to placed behind her ear 2-3 times a day when necessary hot flashes.  Terrance Mass MD, 12:47 PM 06/02/2015

## 2015-06-02 NOTE — Addendum Note (Signed)
Addended by: Burnett Kanaris on: 06/02/2015 01:48 PM   Modules accepted: Orders

## 2015-06-03 LAB — URINALYSIS W MICROSCOPIC + REFLEX CULTURE
BILIRUBIN URINE: NEGATIVE
CASTS: NONE SEEN [LPF]
Glucose, UA: NEGATIVE
HGB URINE DIPSTICK: NEGATIVE
KETONES UR: NEGATIVE
Nitrite: NEGATIVE
Specific Gravity, Urine: 1.028 (ref 1.001–1.035)
Yeast: NONE SEEN [HPF]
pH: 7.5 (ref 5.0–8.0)

## 2015-06-03 LAB — VITAMIN D 25 HYDROXY (VIT D DEFICIENCY, FRACTURES): VIT D 25 HYDROXY: 59 ng/mL (ref 30–100)

## 2015-06-05 LAB — URINE CULTURE

## 2015-06-07 LAB — CYTOLOGY - PAP

## 2015-06-19 ENCOUNTER — Other Ambulatory Visit: Payer: Self-pay | Admitting: Gynecology

## 2015-06-19 MED ORDER — NITROFURANTOIN MONOHYD MACRO 100 MG PO CAPS: 100.0000 mg | ORAL_CAPSULE | Freq: Two times a day (BID) | ORAL | Status: DC

## 2015-07-17 ENCOUNTER — Encounter: Payer: Self-pay | Admitting: Gynecology

## 2015-07-21 ENCOUNTER — Encounter: Payer: Self-pay | Admitting: Gynecology

## 2015-07-25 ENCOUNTER — Other Ambulatory Visit: Payer: Self-pay | Admitting: Radiology

## 2015-08-02 ENCOUNTER — Encounter: Payer: Self-pay | Admitting: Gynecology

## 2016-01-24 ENCOUNTER — Encounter: Payer: Self-pay | Admitting: Gynecology

## 2016-06-03 ENCOUNTER — Encounter: Payer: Self-pay | Admitting: Gynecology

## 2016-06-06 ENCOUNTER — Other Ambulatory Visit: Payer: Self-pay | Admitting: Gynecology

## 2016-06-06 ENCOUNTER — Encounter: Payer: Self-pay | Admitting: Gynecology

## 2016-06-06 ENCOUNTER — Ambulatory Visit (INDEPENDENT_AMBULATORY_CARE_PROVIDER_SITE_OTHER): Payer: BLUE CROSS/BLUE SHIELD | Admitting: Gynecology

## 2016-06-06 VITALS — BP 118/80 | Ht 62.0 in | Wt 141.0 lb

## 2016-06-06 DIAGNOSIS — Z01411 Encounter for gynecological examination (general) (routine) with abnormal findings: Secondary | ICD-10-CM

## 2016-06-06 DIAGNOSIS — N951 Menopausal and female climacteric states: Secondary | ICD-10-CM | POA: Diagnosis not present

## 2016-06-06 DIAGNOSIS — M8580 Other specified disorders of bone density and structure, unspecified site: Secondary | ICD-10-CM

## 2016-06-06 LAB — LIPID PANEL
Cholesterol: 143 mg/dL (ref 125–200)
HDL: 50 mg/dL (ref >=46)
LDL CALC: 76 mg/dL (ref <130)
TRIGLYCERIDES: 84 mg/dL (ref <150)
Total CHOL/HDL Ratio: 2.9 Ratio (ref <=5.0)
VLDL: 17 mg/dL (ref <30)

## 2016-06-06 LAB — CBC WITH DIFFERENTIAL/PLATELET
BASOS ABS: 0 {cells}/uL (ref 0–200)
Basophils Relative: 0 %
EOS PCT: 4 %
Eosinophils Absolute: 248 cells/uL (ref 15–500)
HCT: 41.6 % (ref 35.0–45.0)
Hemoglobin: 14.5 g/dL (ref 11.7–15.5)
LYMPHS ABS: 3162 {cells}/uL (ref 850–3900)
Lymphocytes Relative: 51 %
MCH: 32.1 pg (ref 27.0–33.0)
MCHC: 34.9 g/dL (ref 32.0–36.0)
MCV: 92 fL (ref 80.0–100.0)
MONOS PCT: 6 %
MPV: 10.8 fL (ref 7.5–12.5)
Monocytes Absolute: 372 cells/uL (ref 200–950)
NEUTROS ABS: 2418 {cells}/uL (ref 1500–7800)
Neutrophils Relative %: 39 %
PLATELETS: 295 10*3/uL (ref 140–400)
RBC: 4.52 MIL/uL (ref 3.80–5.10)
RDW: 14 % (ref 11.0–15.0)
WBC: 6.2 10*3/uL (ref 3.8–10.8)

## 2016-06-06 LAB — COMPREHENSIVE METABOLIC PANEL
ALT: 17 U/L (ref 6–29)
AST: 19 U/L (ref 10–35)
Albumin: 4.4 g/dL (ref 3.6–5.1)
Alkaline Phosphatase: 74 U/L (ref 33–130)
BILIRUBIN TOTAL: 0.5 mg/dL (ref 0.2–1.2)
BUN: 8 mg/dL (ref 7–25)
CO2: 29 mmol/L (ref 20–31)
CREATININE: 0.67 mg/dL (ref 0.50–0.99)
Calcium: 9.5 mg/dL (ref 8.6–10.4)
Chloride: 104 mmol/L (ref 98–110)
Glucose, Bld: 78 mg/dL (ref 65–99)
Potassium: 3.7 mmol/L (ref 3.5–5.3)
SODIUM: 142 mmol/L (ref 135–146)
TOTAL PROTEIN: 6.8 g/dL (ref 6.1–8.1)

## 2016-06-06 LAB — TSH: TSH: 1.32 m[IU]/L

## 2016-06-06 MED ORDER — HYDROCHLOROTHIAZIDE 25 MG PO TABS: 25.0000 mg | ORAL_TABLET | Freq: Every day | ORAL | 11 refills | Status: DC

## 2016-06-06 MED ORDER — POTASSIUM CHLORIDE CRYS ER 20 MEQ PO TBCR
20.0000 meq | EXTENDED_RELEASE_TABLET | Freq: Every day | ORAL | 0 refills | Status: DC
Start: 2016-06-06 — End: 2024-02-09

## 2016-06-06 MED ORDER — VENLAFAXINE HCL ER 37.5 MG PO CP24: 37.5000 mg | ORAL_CAPSULE | Freq: Every day | ORAL | 11 refills | Status: DC

## 2016-06-06 NOTE — Patient Instructions (Signed)
Bone Densitometry Bone densitometry is an imaging test that uses a special X-ray to measure the amount of calcium and other minerals in your bones (bone density). This test is also known as a bone mineral density test or dual-energy X-ray absorptiometry (DXA). The test can measure bone density at your hip and your spine. It is similar to having a regular X-ray. You may have this test to:  Diagnose a condition that causes weak or thin bones (osteoporosis).  Predict your risk of a broken bone (fracture).  Determine how well osteoporosis treatment is working. LET [facility] CARE PROVIDER KNOW ABOUT:  Any allergies you have.  All medicines you are taking, including vitamins, herbs, eye drops, creams, and over-the-counter medicines.  Previous problems you or members of your family have had with the use of anesthetics.  Any blood disorders you have.  Previous surgeries you have had.  Medical conditions you have.  Possibility of pregnancy.  Any other medical test you had within the previous 14 days that used contrast material. RISKS AND COMPLICATIONS Generally, this is a safe procedure. However, problems can occur and may include the following:  This test exposes you to a very small amount of radiation.  The risks of radiation exposure may be greater to unborn children. BEFORE THE PROCEDURE  Do not take any calcium supplements for 24 hours before having the test. You can otherwise eat and drink what you usually do.  Take off all metal jewelry, eyeglasses, dental appliances, and any other metal objects. PROCEDURE  You may lie on an exam table. There will be an X-ray generator below you and an imaging device above you.  Other devices, such as boxes or braces, may be used to position your body properly for the scan.  You will need to lie still while the machine slowly scans your body.  The images will show up on a computer monitor. AFTER THE PROCEDURE You may need more testing  at a later time.   This information is not intended to replace advice given to you by your health care provider. Make sure you discuss any questions you have with your health care provider.   Document Released: 09/10/2004 Document Revised: 09/09/2014 Document Reviewed: 01/27/2014 Elsevier Interactive Patient Education 2016 Elsevier Inc. Venlafaxine extended-release capsules What is this medicine? VENLAFAXINE(VEN la fax een) is used to treat depression, anxiety and panic disorder. This medicine may be used for other purposes; ask your health care provider or pharmacist if you have questions. What should I tell my health care provider before I take this medicine? They need to know if you have any of these conditions: -bleeding disorders -glaucoma -heart disease -high blood pressure -high cholesterol -kidney disease -liver disease -low levels of sodium in the blood -mania or bipolar disorder -seizures -suicidal thoughts, plans, or attempt; a previous suicide attempt by you or a family -take medicines that treat or prevent blood clots -thyroid disease -an unusual or allergic reaction to venlafaxine, desvenlafaxine, other medicines, foods, dyes, or preservatives -pregnant or trying to get pregnant -breast-feeding How should I use this medicine? Take this medicine by mouth with a full glass of water. Follow the directions on the prescription label. Do not cut, crush, or chew this medicine. Take it with food. If needed, the capsule may be carefully opened and the entire contents sprinkled on a spoonful of cool applesauce. Swallow the applesauce/pellet mixture right away without chewing and follow with a glass of water to ensure complete swallowing of the pellets. Try  to take your medicine at about the same time each day. Do not take your medicine more often than directed. Do not stop taking this medicine suddenly except upon the advice of your doctor. Stopping this medicine too quickly may  cause serious side effects or your condition may worsen. A special MedGuide will be given to you by the pharmacist with each prescription and refill. Be sure to read this information carefully each time. Talk to your pediatrician regarding the use of this medicine in children. Special care may be needed. Overdosage: If you think you have taken too much of this medicine contact a poison control center or emergency room at once. NOTE: This medicine is only for you. Do not share this medicine with others. What if I miss a dose? If you miss a dose, take it as soon as you can. If it is almost time for your next dose, take only that dose. Do not take double or extra doses. What may interact with this medicine? Do not take this medicine with any of the following medications: -certain medicines for fungal infections like fluconazole, itraconazole, ketoconazole, posaconazole, voriconazole -cisapride -desvenlafaxine -dofetilide -dronedarone -duloxetine -levomilnacipran -linezolid -MAOIs like Carbex, Eldepryl, Marplan, Nardil, and Parnate -methylene blue (injected into a vein) -milnacipran -pimozide -thioridazine -ziprasidone This medicine may also interact with the following medications: -aspirin and aspirin-like medicines -certain medicines for depression, anxiety, or psychotic disturbances -certain medicines for migraine headaches like almotriptan, eletriptan, frovatriptan, naratriptan, rizatriptan, sumatriptan, zolmitriptan -certain medicines for sleep -certain medicines that treat or prevent blood clots like dalteparin, enoxaparin, warfarin -cimetidine -clozapine -diuretics -fentanyl -furazolidone -indinavir -isoniazid -lithium -metoprolol -NSAIDS, medicines for pain and inflammation, like ibuprofen or naproxen -other medicines that prolong the QT interval (cause an abnormal heart rhythm) -procarbazine -rasagiline -supplements like St. John's wort, kava kava,  valerian -tramadol -tryptophan This list may not describe all possible interactions. Give your health care provider a list of all the medicines, herbs, non-prescription drugs, or dietary supplements you use. Also tell them if you smoke, drink alcohol, or use illegal drugs. Some items may interact with your medicine. What should I watch for while using this medicine? Tell your doctor if your symptoms do not get better or if they get worse. Visit your doctor or health care professional for regular checks on your progress. Because it may take several weeks to see the full effects of this medicine, it is important to continue your treatment as prescribed by your doctor. Patients and their families should watch out for new or worsening thoughts of suicide or depression. Also watch out for sudden changes in feelings such as feeling anxious, agitated, panicky, irritable, hostile, aggressive, impulsive, severely restless, overly excited and hyperactive, or not being able to sleep. If this happens, especially at the beginning of treatment or after a change in dose, call your health care professional. This medicine can cause an increase in blood pressure. Check with your doctor for instructions on monitoring your blood pressure while taking this medicine. You may get drowsy or dizzy. Do not drive, use machinery, or do anything that needs mental alertness until you know how this medicine affects you. Do not stand or sit up quickly, especially if you are an older patient. This reduces the risk of dizzy or fainting spells. Alcohol may interfere with the effect of this medicine. Avoid alcoholic drinks. Your mouth may get dry. Chewing sugarless gum, sucking hard candy and drinking plenty of water will help. Contact your doctor if the problem does not go  away or is severe. What side effects may I notice from receiving this medicine? Side effects that you should report to your doctor or health care professional as soon as  possible: -allergic reactions like skin rash, itching or hives, swelling of the face, lips, or tongue -breathing problems -changes in vision -hallucination, loss of contact with reality -seizures -suicidal thoughts or other mood changes -trouble passing urine or change in the amount of urine -unusual bleeding or bruising Side effects that usually do not require medical attention (report to your doctor or health care professional if they continue or are bothersome): -change in sex drive or performance -constipation -increased sweating -loss of appetite -nausea -tremors -weight loss This list may not describe all possible side effects. Call your doctor for medical advice about side effects. You may report side effects to FDA at 1-800-FDA-1088. Where should I keep my medicine? Keep out of the reach of children. Store at a controlled temperature between 20 and 25 degrees C (68 degrees and 77 degrees F), in a dry place. Throw away any unused medicine after the expiration date. NOTE: This sheet is a summary. It may not cover all possible information. If you have questions about this medicine, talk to your doctor, pharmacist, or health care provider.    2016, Elsevier/Gold Standard. (2013-03-16 12:46:03)

## 2016-06-06 NOTE — Progress Notes (Signed)
Rachel Duffy 04-29-1951 [id number]   History:    65 y.o.  for annual gyn exam with complaints of worsening hot flashes during the day and waking her up at night. She has tried multiple regimens in the past to include Brisd[place] and Peppermint oil. In the past when she has used oral contraceptive pill she experienced swelling of the legs are was discontinued. It is affecting her quality of life.Patient with past history of osteopenia last bone density study 2015 lowest T score -1.2 at the [facility]. 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 Patient with past history of colon polyp. Last colonoscopy 2013. Mother with history of colon cancer. Patient not interested in flu vaccine.  Past medical history,surgical history, family history and social history were all reviewed and documented in the EPIC chart.  Gynecologic History No LMP recorded. Patient is postmenopausal. Contraception: post menopausal status Last Pap: 2013 in 2016. Results were: normal Last mammogram: See above. Results were: See above  Obstetric History OB History  Gravida Para Term Preterm AB Living  1 1 1     1   SAB TAB Ectopic Multiple Live Births               # Outcome Date GA Lbr Len/2nd Weight Sex Delivery Anes PTL Lv  1 Term                ROS: A ROS was performed and pertinent positives and negatives are included in the history.  GENERAL: No fevers or chills. HEENT: No change in vision, no earache, sore throat or sinus congestion. NECK: No pain or stiffness. CARDIOVASCULAR: No chest pain or pressure. No palpitations. PULMONARY: No shortness of breath, cough or wheeze. GASTROINTESTINAL:  No abdominal pain, nausea, vomiting or diarrhea, melena or bright red blood per rectum. GENITOURINARY: No urinary frequency, urgency, hesitancy or dysuria. MUSCULOSKELETAL: No joint or muscle pain, no back pain, no recent trauma. DERMATOLOGIC: No rash, no itching, no lesions. ENDOCRINE: No polyuria, polydipsia, no heat or cold intolerance. No recent change in weight. HEMATOLOGICAL: No anemia or easy bruising or bleeding. NEUROLOGIC: No headache, seizures, numbness, tingling or weakness. PSYCHIATRIC: No depression, no loss of interest in normal activity or change in sleep pattern.     Exam: chaperone present  BP 118/80   Ht 5\' 2"  (1.575 m)   Wt 141 lb (64 kg)   BMI 25.79 kg/m   Body mass index is 25.79 kg/m.  General appearance : Well developed well nourished female. No acute distress HEENT: Eyes: no retinal hemorrhage or exudates,  Neck supple, trachea midline, no carotid bruits, no thyroidmegaly Lungs: Clear to auscultation, no rhonchi or wheezes, or rib retractions  Heart: Regular rate and rhythm, no murmurs or gallops Breast:Examined in sitting and supine position were symmetrical in appearance, no palpable masses or tenderness,  no skin retraction, no nipple inversion, no nipple discharge, no skin discoloration, no axillary or supraclavicular lymphadenopathy Abdomen: no palpable masses or tenderness, no rebound or guarding Extremities: no edema or skin discoloration or tenderness  Pelvic:  Bartholin, Urethra, Skene Glands: Within normal limits  Vagina: No gross lesions or discharge, atrophic changes  Cervix: No gross lesions or discharge  Uterus  anteverted, normal size, shape and consistency, non-tender and mobile  Adnexa  Without masses or tenderness  Anus and perineum  normal   Rectovaginal  normal sphincter tone without palpated masses or tenderness             Hemoccult cards will be provided     Assessment/Plan:  65 y.o. female for annual exam with severe  vasomotor symptoms. We had a lengthy discussion once again of the women's health initiative study. With her history of swelling of the leg in the past so would not recommend estrogen. We are going to try an alternative treatment for hot flashes that has been approved by the [employer] which is extended release daily daily. Her Pap smear was not done today according to the new guidelines. The following screening blood work was ordered today: Comprehensive metabolic panel, fasting lipid profile, TSH, CBC, and urinalysis. Last year her hemoglobin A1c was slightly elevated so she wishes to have her hemoglobin A1c tested today so we'll be ordered as well. Because of her history of osteopenia in the past and vitamin D level will be ordered. Patient to schedule her next bone density study and mammogram in November of this year. We discussed importance of calcium vitamin D and weightbearing exercises for osteoporosis prevention. She was provided with fecal Hemoccult cards to submit to the office for testing.   Terrance Mass MD, 11:39 AM 06/06/2016

## 2016-06-07 LAB — URINALYSIS W MICROSCOPIC + REFLEX CULTURE
BILIRUBIN URINE: NEGATIVE
Casts: NONE SEEN [LPF]
Crystals: NONE SEEN [HPF]
GLUCOSE, UA: NEGATIVE
Hgb urine dipstick: NEGATIVE
Ketones, ur: NEGATIVE
LEUKOCYTES UA: NEGATIVE
NITRITE: NEGATIVE
PH: 8.5 — AB (ref 5.0–8.0)
Specific Gravity, Urine: 1.023 (ref 1.001–1.035)
YEAST: NONE SEEN [HPF]

## 2016-06-07 LAB — VITAMIN D 25 HYDROXY (VIT D DEFICIENCY, FRACTURES): VIT D 25 HYDROXY: 54 ng/mL (ref 30–100)

## 2016-06-07 LAB — HEMOGLOBIN A1C
HEMOGLOBIN A1C: 5.4 % (ref <5.7)
MEAN PLASMA GLUCOSE: 108 mg/dL

## 2016-06-08 LAB — URINE CULTURE

## 2016-07-31 ENCOUNTER — Encounter: Payer: Self-pay | Admitting: Gynecology

## 2016-08-06 ENCOUNTER — Encounter: Payer: Self-pay | Admitting: Gynecology

## 2016-08-06 ENCOUNTER — Encounter: Payer: Self-pay | Admitting: Anesthesiology

## 2016-08-07 ENCOUNTER — Encounter: Payer: Self-pay | Admitting: Anesthesiology

## 2016-08-22 ENCOUNTER — Telehealth: Payer: Self-pay | Admitting: Gynecology

## 2016-08-22 ENCOUNTER — Ambulatory Visit (INDEPENDENT_AMBULATORY_CARE_PROVIDER_SITE_OTHER): Payer: BLUE CROSS/BLUE SHIELD | Admitting: Gynecology

## 2016-08-22 ENCOUNTER — Encounter: Payer: Self-pay | Admitting: Gynecology

## 2016-08-22 VITALS — BP 138/86

## 2016-08-22 DIAGNOSIS — M810 Age-related osteoporosis without current pathological fracture: Secondary | ICD-10-CM | POA: Insufficient documentation

## 2016-08-22 NOTE — Progress Notes (Signed)
Patient is a 65 year old who was seen in the office for her annual exam on October 5 of this year and was scheduled to have a bone density which she had recently and this is the reason for today's office visit. Patient is on no hormone replacement therapy but is taking her calcium and vitamin D.  Review of her previous bone density study had indicated that her lowest T score was at the [facility] with a value -1.2 and this year's bone density study indicated her lowest T score was at the [facility] with a value of -3.1 indicative osteoporosis.  Her recent calcium and vitamin D were normal as was her CBC, comprehensive metabolic panel and TSH.  We had a lengthy discussion on her recent bone density study results which indicates that her bone mass is 25% below normal and her risk of a spinal fracture is a times the general population. We discussed different treatment options from oral antiresorptive agents to monoclonal antibody injections every 6 months to IV infusion of antiresorptive agent and to include daily injections for 2 years with parathyroid hormone.  The following was discussed in detail: The risk and benefits of oral bisphosphonate therapy were conveyed to the patient in today's visit. Benefits include a significant risk reduction of vertebral, hip and non vertebral fractures. We also discussed potential adverse effects to include precipitation or aggravation of GERD reflux disease as well as the risk of upper GI bleeding although this is reported in the literature to be extremely rare. Other rare adverse effects that were discussed of patient taking oral bisphosphonate included a 1 in 10,000-10,000 risk for osteonecrosis of the jaw, and a risk for atypical femoral fractures of approximately 1 in 5000-10,000. Signs and symptoms of atypical femoral fracture were also discussed and the patient was informed to contact our office if any unusual symptoms were to develop.  The risk and benefits of IV  bisphosphonate were discussed with the patient today to include a significant risk reduction for vertebral, hip and non-vertebral fractures. Potential risk of therapy were also discussed to include a 10-15% chance for a flulike reaction which may last 2-3 days after IV bisphosphonate. We also discussed that longer reactions lasting perhaps weeks to months have been rarely reported to the FDA following IV bisphosphonate treatment. This would also require symptomatic management. Other potential risk that were discussed included the following: Regular eye irritation, approximately 1 and 1000-10,000 risk for osteonecrosis of the jaw as well as a risk for atypical femoral fracture of approximately 1 in 5000-10,000 based on current data on oral bisphosphonate. Signs and symptoms of atypical femoral fracture were also discussed and the patient was asked to contact the office if she develops any of these symptoms.  The risk and benefits of Evista were discussed with the patient today, including a significant risk reduction for vertebral fractures. Other potential risks discussed with the patient include the most common reported adverse effect which are flushes and leg cramps. We also discussed the risk for DVT (deep venous thrombosis) of approximately 1 in 400, similar to that seen in patients taking oral estrogens. The fact that there also may be an increased risk for fatal stroke in individuals with a history of cardiovascular disease and or risk factors of heart attack and stroke based on the RUTH trial was also discussed with the patient.  The risk and benefits of Forteo were discussed with the patient today to include a significant risk reduction for vertebral and nontender  vertebral fractures. The most common side effects that have been reported include: Dizziness, and leg cramps although generally not severe enough to warrant discontinuation in the majority of patient on this medication. The black box warning was  discussed in depth, which is based on the fact that supra-pharmacological doses of Forteo did increase the risk for benign and malignant bone tumors in rats, though there has not been an observed increase incidence in humans to date, based on over a decade of clinical experience. Patient does not have any relative contraindication to Forteo, including no history of previous therapeutic radiation therapy, active neoplasms involving the skeleton or Paget's disease.  The risk and benefits of Prolia were discussed with the patient today to include a significant risk reduction for vertebral, hip and non-vertebral fractures. Potential risk also discussed were skin advanced to include rash, pruritus, eczema and other skin reactions. The recurrence of infections and clinical studies with PROLIA, including serious infections were also discussed with the patient. In 3 year clinical trial with Prolia although six-year trial data does not show any evidence for any additional or accelerating risk of infection. Other risks that were discussed included a 1 in 1000-10,000 risk of osteonecrosis of the jaw based on current available data  Patient after weighing all the risk and modes of administration has decided to proceed with Prolia (Denosaub) 60 mg injection subcutaneous every 6 months. Literature information was provided. We'll make arrangements for her to start receiving it soon. It would be recommended that one year after initiating treatment that a bone density study be done to monitor response to treatment.  Greater than 90% of time was spent counseling coordinating care for this patient with osteoporosis. Time of consultation 25 minutes

## 2016-08-22 NOTE — Patient Instructions (Signed)
Osteoporosis (Osteoporosis) La osteoporosis ocurre cuando los huesos se vuelven ms frgiles y ms dbiles. Los huesos dbiles pueden romperse (fracturarse) con mayor facilidad cuando se resbala o sufre una cada. Los [employer] corren mayor riesgo de fracturarse son los [place] muecas y la columna vertebral. CUIDADOS EN EL HOGAR  Ingiera suficiente cantidad de calcio y vitamina D. Estos nutrientes son buenos para los [place].  Haga ejercicios tal como le indic el mdico.  No use productos que contengan tabaco, por ejemplo, cigarrillos, tabaco para Higher education careers adviser y Psychologist, sport and exercise. Si necesita ayuda para dejar de fumar, consulte al mdico.  Limite la cantidad de alcohol que bebe.  Tome los medicamentos solamente como se lo haya indicado el mdico.  Concurra a todas las visitas de control como se lo haya indicado el mdico. Esto es importante.  Tome las precauciones necesarias para evitar cadas en su casa. Algunas formas de hacer esto son las siguientes:  Mantenga las habitaciones bien iluminadas y [place].  Coloque barandas de seguridad en las escaleras.  Coloque tapetes de caucho en el bao y en otros sectores que a menudo estn mojadas o son resbaladizas. SOLICITE AYUDA DE INMEDIATO SI:  Se cae.  Sufre una lesin. Esta informacin no tiene Marine scientist el consejo del mdico. Asegrese de hacerle al mdico cualquier pregunta que tenga. Document Released: 02/18/2012 Document Revised: 09/09/2014 Document Reviewed: 01/27/2014 Elsevier Interactive Patient Education  2017 [employer].

## 2016-08-22 NOTE — Telephone Encounter (Signed)
Rachel Duffy, please verify coverage for this patient who will need Prolia. She has osteoporosis [facility] -3.1. Labs up-to-date   Note from Dr Moshe Salisbury. Complete exam 06/2016, Calcium 06/06/16  9.5

## 2016-09-12 NOTE — Telephone Encounter (Signed)
Received insurance benefits [employer]BS, No deductible, Co Pay $5 with OV, 100% coverage No OV, No co insurance , OOPM $2450 ($0 met). PA needed. Calcium 06/06/16  9.5  M81.0  PA reference # [id number]

## 2016-09-30 NOTE — Telephone Encounter (Signed)
Deniel letter received, appeal  letter to be sent form provider.

## 2016-10-03 NOTE — Telephone Encounter (Signed)
PC to pt explained denial letter and also explained her insurance benefits . She understands I will call her after we receive information.

## 2016-10-14 MED ORDER — ALENDRONATE SODIUM 70 MG PO TABS: 70.0000 mg | ORAL_TABLET | ORAL | 11 refills | Status: DC

## 2016-10-14 NOTE — Telephone Encounter (Signed)
Please notify patient and let her know to this effect. Call in Fosamax 70 mg one by mouth every weekly #4 tablets with 11 refills

## 2016-10-14 NOTE — Telephone Encounter (Addendum)
I will call pt and ask Anderson Malta to call in meds for patient. I called Emri and explained that insurance requires trial of oral medication prior to Prolia. She will pick up RX and call if she is unable to tolerate medication.

## 2016-10-14 NOTE — Telephone Encounter (Signed)
Prolia denied by insurance company due to no history of oral medications and no history of GERD or other GI problems. Per insurance guidelines patient must fail oral medications first.

## 2016-10-14 NOTE — Telephone Encounter (Signed)
Rx has been sent  

## 2016-10-17 ENCOUNTER — Encounter: Payer: Self-pay | Admitting: Anesthesiology

## 2016-12-17 ENCOUNTER — Encounter: Payer: Self-pay | Admitting: Gastroenterology

## 2016-12-20 ENCOUNTER — Encounter: Payer: Self-pay | Admitting: Gastroenterology

## 2017-01-15 ENCOUNTER — Encounter: Payer: Self-pay | Admitting: Gynecology

## 2017-02-10 ENCOUNTER — Ambulatory Visit (AMBULATORY_SURGERY_CENTER): Payer: Self-pay | Admitting: *Deleted

## 2017-02-10 ENCOUNTER — Other Ambulatory Visit: Payer: Self-pay | Admitting: Gynecology

## 2017-02-10 ENCOUNTER — Encounter: Payer: Self-pay | Admitting: Gastroenterology

## 2017-02-10 VITALS — Ht 63.0 in | Wt 147.0 lb

## 2017-02-10 DIAGNOSIS — Z800 Family history of malignant neoplasm of digestive organs: Secondary | ICD-10-CM

## 2017-02-10 DIAGNOSIS — Z860100 Personal history of colon polyps, unspecified: Secondary | ICD-10-CM

## 2017-02-10 MED ORDER — NA SULFATE-K SULFATE-MG SULF 17.5-3.13-1.6 GM/177ML PO SOLN: ORAL | 0 refills | Status: DC

## 2017-02-10 NOTE — Progress Notes (Signed)
Patient denies any allergies to eggs or soy. Patient denies any problems with anesthesia/sedation. Patient denies any oxygen use at home and does not take any diet/weight loss medications. NO e-mail per pt!

## 2017-02-24 ENCOUNTER — Ambulatory Visit (AMBULATORY_SURGERY_CENTER): Payer: BLUE CROSS/BLUE SHIELD | Admitting: Gastroenterology

## 2017-02-24 ENCOUNTER — Encounter: Payer: Self-pay | Admitting: Gastroenterology

## 2017-02-24 VITALS — BP 126/87 | HR 61 | Temp 97.1°F | Resp 10 | Ht 63.0 in | Wt 147.0 lb

## 2017-02-24 DIAGNOSIS — Z86010 Personal history of colonic polyps: Secondary | ICD-10-CM

## 2017-02-24 MED ORDER — SODIUM CHLORIDE 0.9 % IV SOLN: 500.0000 mL | INTRAVENOUS | Status: AC

## 2017-02-24 NOTE — Progress Notes (Signed)
Report given to PACU, vss 

## 2017-02-24 NOTE — Patient Instructions (Signed)
YOU HAD AN ENDOSCOPIC PROCEDURE TODAY AT THE [facility]:   Refer to the procedure report that was given to you for any specific questions about what was found during the examination.  If the procedure report does not answer your questions, please call your gastroenterologist to clarify.  If you requested that your care partner not be given the details of your procedure findings, then the procedure report has been included in a sealed envelope for you to review at your convenience later.  YOU SHOULD EXPECT: Some feelings of bloating in the abdomen. Passage of more gas than usual.  Walking can help get rid of the air that was put into your GI tract during the procedure and reduce the bloating. If you had a lower endoscopy (such as a colonoscopy or flexible sigmoidoscopy) you may notice spotting of blood in your stool or on the toilet paper. If you underwent a bowel prep for your procedure, you may not have a normal bowel movement for a few days.  Please Note:  You might notice some irritation and congestion in your nose or some drainage.  This is from the oxygen used during your procedure.  There is no need for concern and it should clear up in a day or so.  SYMPTOMS TO REPORT IMMEDIATELY:   Following lower endoscopy (colonoscopy or flexible sigmoidoscopy):  Excessive amounts of blood in the stool  Significant tenderness or worsening of abdominal pains  Swelling of the abdomen that is new, acute  Fever of 100F or higher   For urgent or emergent issues, a gastroenterologist can be reached at any hour by calling [phone].   DIET:  We do recommend a small meal at first, but then you may proceed to your regular diet.  Drink plenty of fluids but you should avoid alcoholic beverages for 24 hours.  ACTIVITY:  You should plan to take it easy for the rest of today and you should NOT DRIVE or use heavy machinery until tomorrow (because of the sedation medicines used during the test).     FOLLOW UP: Our staff will call the number listed on your records the next business day following your procedure to check on you and address any questions or concerns that you may have regarding the information given to you following your procedure. If we do not reach you, we will leave a message.  However, if you are feeling well and you are not experiencing any problems, there is no need to return our call.  We will assume that you have returned to your regular daily activities without incident.  If any biopsies were taken you will be contacted by phone or by letter within the next 1-3 weeks.  Please call us at [phone] if you have not heard about the biopsies in 3 weeks.    SIGNATURES/CONFIDENTIALITY: You and/or your care partner have signed paperwork which will be entered into your electronic medical record.  These signatures attest to the fact that that the information above on your After Visit Summary has been reviewed and is understood.  Full responsibility of the confidentiality of this discharge information lies with you and/or your care-partner.  Thank you for letting us take care of your healthcare needs today. 

## 2017-02-24 NOTE — Progress Notes (Signed)
Pt's states no medical or surgical changes since previsit or office visit. 

## 2017-02-24 NOTE — Op Note (Signed)
[place] Patient Name: Rachel Duffy Procedure Date: 02/24/2017 11:33 AM MRN: [id number] Endoscopist: Mallie Mussel L. Loletha Carrow , MD Age: 66 Referring MD:  Date of Birth: Dec 02, 1950 Gender: Female Account #: [id number] Procedure:                Colonoscopy Indications:              Surveillance: Personal history of adenomatous                            polyps on last colonoscopy 5 years ago Medicines:                Monitored Anesthesia Care Procedure:                Pre-Anesthesia Assessment:                           - Prior to the procedure, a History and Physical                            was performed, and patient medications and                            allergies were reviewed. The patient's tolerance of                            previous anesthesia was also reviewed. The risks                            and benefits of the procedure and the sedation                            options and risks were discussed with the patient.                            All questions were answered, and informed consent                            was obtained. Prior Anticoagulants: The patient has                            taken no previous anticoagulant or antiplatelet                            agents. ASA Grade Assessment: II - A patient with                            mild systemic disease. After reviewing the risks                            and benefits, the patient was deemed in                            satisfactory condition to undergo the procedure.  After obtaining informed consent, the colonoscope                            was passed under direct vision. Throughout the                            procedure, the patient's blood pressure, pulse, and                            oxygen saturations were monitored continuously. The                            Colonoscope was introduced through the anus and                            advanced to the the cecum,  identified by                            appendiceal orifice and ileocecal valve. The                            colonoscopy was performed without difficulty. The                            patient tolerated the procedure well. The quality                            of the bowel preparation was good. The ileocecal                            valve, appendiceal orifice, and rectum were                            photographed. The quality of the bowel preparation                            was evaluated using the BBPS [facility] Bowel                            Preparation Scale) with scores of: Right Colon = 2,                            Transverse Colon = 2 and Left Colon = 2. The total                            BBPS score equals 6. The bowel preparation used was                            SUPREP. Scope In: 11:37:14 AM Scope Out: 11:48:16 AM Scope Withdrawal Time: 0 hours 8 minutes 0 seconds  Total Procedure Duration: 0 hours 11 minutes 2 seconds  Findings:                 The digital rectal exam findings  include decreased                            sphincter tone.                           An area of melanosis was found in the entire colon.                           The exam was otherwise without abnormality on                            direct and retroflexion views. Complications:            No immediate complications. Estimated Blood Loss:     Estimated blood loss: none. Impression:               - Decreased sphincter tone found on digital rectal                            exam.                           - Melanosis in the colon.                           - The examination was otherwise normal on direct                            and retroflexion views.                           - No specimens collected. Recommendation:           - Patient has a contact number available for                            emergencies. The signs and symptoms of potential                            delayed  complications were discussed with the                            patient. Return to normal activities tomorrow.                            Written discharge instructions were provided to the                            patient.                           - Resume previous diet.                           - Continue present medications.                           -  Repeat colonoscopy in 5 years due to Hughes in mother. Henry L. Loletha Carrow, MD 02/24/2017 11:51:28 AM This report has been signed electronically.

## 2017-02-25 ENCOUNTER — Telehealth: Payer: Self-pay

## 2017-02-25 ENCOUNTER — Telehealth: Payer: Self-pay | Admitting: *Deleted

## 2017-02-25 NOTE — Telephone Encounter (Signed)
  Follow up Call-  Call back number 02/24/2017  Post procedure Call Back phone  # [phone]  Permission to leave phone message Yes  Some recent data might be hidden     Patient questions:  Do you have a fever, pain , or abdominal swelling? No. Pain Score  0 *  Have you tolerated food without any problems? Yes.    Have you been able to return to your normal activities? Yes.    Do you have any questions about your discharge instructions: Diet   Yes.   Medications  No. Follow up visit  No.  Do you have questions or concerns about your Care? Yes.    Actions: * If pain score is 4 or above: No action needed, pain <4.

## 2017-02-25 NOTE — Telephone Encounter (Signed)
Attempted to reach pt. With follow up call following endoscopic procedure 02/24/17.  LM on pt. Ans. Machine.  Will try to reach pt. Later today. 

## 2017-06-10 ENCOUNTER — Encounter: Payer: Self-pay | Admitting: Gynecology

## 2017-06-10 ENCOUNTER — Ambulatory Visit (INDEPENDENT_AMBULATORY_CARE_PROVIDER_SITE_OTHER): Payer: Medicare Other | Admitting: Gynecology

## 2017-06-10 VITALS — BP 122/80 | Ht 63.0 in | Wt 148.0 lb

## 2017-06-10 DIAGNOSIS — M810 Age-related osteoporosis without current pathological fracture: Secondary | ICD-10-CM

## 2017-06-10 DIAGNOSIS — N952 Postmenopausal atrophic vaginitis: Secondary | ICD-10-CM | POA: Diagnosis not present

## 2017-06-10 DIAGNOSIS — N951 Menopausal and female climacteric states: Secondary | ICD-10-CM | POA: Diagnosis not present

## 2017-06-10 DIAGNOSIS — Z01411 Encounter for gynecological examination (general) (routine) with abnormal findings: Secondary | ICD-10-CM

## 2017-06-10 MED ORDER — RISEDRONATE SODIUM 150 MG PO TABS: 150.0000 mg | ORAL_TABLET | ORAL | 12 refills | Status: DC

## 2017-06-10 NOTE — Progress Notes (Signed)
Rachel Duffy Jan 10, 1951 [id number]        66 y.o.  G1P1001 for annual gynecologic exam.  Also complaining of continued menopausal symptoms of hot flushes and sweats. Has been going on for years. Has tried "every available over-the-counter option". Has never tried HRT. Also diagnosed with osteoporosis last year and started on Fosamax. Is having a lot of GERD with this and notes that she only takes it every several weeks because of it.  Past medical history,surgical history, problem list, medications, allergies, family history and social history were all reviewed and documented as reviewed in the EPIC chart.  ROS:  Performed with pertinent positives and negatives included in the history, assessment and plan.   Additional significant findings :  None   Exam: [name] assistant Vitals:   06/10/17 1036  BP: 122/80  Weight: 148 lb (67.1 kg)  Height: 5\' 3"  (1.6 m)   Body mass index is 26.22 kg/m.  General appearance:  Normal affect, orientation and appearance. Skin: Grossly normal HEENT: Without gross lesions.  No cervical or supraclavicular adenopathy. Thyroid normal.  Lungs:  Clear without wheezing, rales or rhonchi Cardiac: RR, without RMG Abdominal:  Soft, nontender, without masses, guarding, rebound, organomegaly or hernia Breasts:  Examined lying and sitting without masses, retractions, discharge or axillary adenopathy. Pelvic:  Ext, BUS, Vagina: With atrophic changes  Cervix: With atrophic changes  Uterus: Anteverted, normal size, shape and contour, midline and mobile nontender   Adnexa: Without masses or tenderness    Anus and perineum: Normal   Rectovaginal: Normal sphincter tone without palpated masses or tenderness.    Assessment/Plan:  66 y.o. G71P1001 female for annual gynecologic exam.  1. Postmenopausal/atrophic genital changes/menopausal symptoms. Continues with hot flushes and sweats for years. Has tried a variety of over-the-counter options. I reviewed  various OTC products to include Estrovin. I also reviewed HRT with her. Risks to include stroke heart attack DVT as well as the breast cancer issue discussed. Benefits as far as bone health symptom relief cardiovascular health also discussed. Patient is adamant against taking HRT. Will try Estrovin to see if it does not help. Has done no bleeding and she knows to call if any bleeding. 2. Osteoporosis. DEXA 2017 T score -3.1 AP spine. Having GERD with Fosamax started last year. Taking every several weeks. Reviewed with patient I know if less frequent dosage gives her any benefit or not. Alternatives to include Prolia and Reclast discussed. Also reviewed trial of Actonel with less frequent dosing and may be different formulation would help. Risks of Prolia to include osteonecrosis of the jaw, atypical fractures, rashes and infections discussed. Patient very reluctant to consider Prolia. Would prefer to try Actonel first and if significant GERD consider Reclast. Actonel 150 mg once monthly refill 1112 prescribed. Call if any issues once starting. 3. Mammography when due end of November/December. Reminded patient to call and schedule. Breast exam normal today. 4. Colonoscopy 2018. Repeat at their recommended interval. 5. Pap smear/HPV 2016. No Pap smear done today. Dr. Toney Rakes has noted cryosurgery 1980 in her history. Otherwise no abnormal Pap smears since. 6. Health maintenance. No routine lab work done. Patient does not have a primary physician. Strongly recommended patient establish care with primary physician at age 85 for ongoing healthcare and blood work. Patient agrees to call and schedule.   Additional time in excess of her routine gynecologicc exam was spent in direct face to face counseling and coordination of care in regards to her menopausal symptoms  and osteoporosis.    Anastasio Auerbach MD, 11:14 AM 06/10/2017

## 2017-06-10 NOTE — Patient Instructions (Signed)
Start on the once a month bone medication. Call me if you have any issues with this.  Make arrangements to be followed by a primary physician for ongoing general healthcare blood work.  Try across the counter Estrovin to see if it does not help with your menopausal symptoms.

## 2017-07-02 ENCOUNTER — Other Ambulatory Visit: Payer: Self-pay

## 2017-07-02 ENCOUNTER — Other Ambulatory Visit: Payer: Self-pay | Admitting: Gynecology

## 2017-07-02 MED ORDER — HYDROCHLOROTHIAZIDE 25 MG PO TABS: 25.0000 mg | ORAL_TABLET | Freq: Every day | ORAL | 1 refills | Status: AC

## 2017-07-02 NOTE — Telephone Encounter (Signed)
05/20/2014 visit with Dr. Moshe Salisbury he wrote "She has history of hypertension and is on HCTZ and this made a appointment to see me PCP which is going to be Dr. Jeanann Lewandowsky."  However, looks like he continue to prescribe and last Oct gave her a prescription for one year's worth.   I see you talked with her about getting a PCP at visit.

## 2017-07-02 NOTE — Telephone Encounter (Signed)
Okay for 22-month refill only to allow time for her to get to see a primary physician but no further refills past that.  Patient needs to understand she needs a primary physician for ongoing general health care

## 2017-07-02 NOTE — Telephone Encounter (Signed)
I spoke with patient and explained this to her. She said she has been calling around trying to get an appt with PCP and she has an appointment scheduled for June. I explained that Dr. Loetta Rough is refilling her medication for 2 mos and she really needs to see PCP in 2 mos time so no break in medication. She said that she had left some messages and was told that they might be able to get her in in December. I encouraged her to go see [facility] at [facility] for walk-in appointment if she cannot get in in the next mos or two with scheduled PCP. Refills sent.

## 2017-08-01 ENCOUNTER — Encounter: Payer: Self-pay | Admitting: Gynecology

## 2017-09-02 DIAGNOSIS — M810 Age-related osteoporosis without current pathological fracture: Secondary | ICD-10-CM

## 2017-09-02 HISTORY — DX: Age-related osteoporosis without current pathological fracture: M81.0

## 2018-06-18 ENCOUNTER — Encounter: Payer: Self-pay | Admitting: Gynecology

## 2018-06-18 ENCOUNTER — Ambulatory Visit (INDEPENDENT_AMBULATORY_CARE_PROVIDER_SITE_OTHER): Payer: Medicare Other | Admitting: Gynecology

## 2018-06-18 VITALS — BP 130/84 | Ht 63.0 in | Wt 150.0 lb

## 2018-06-18 DIAGNOSIS — M810 Age-related osteoporosis without current pathological fracture: Secondary | ICD-10-CM | POA: Diagnosis not present

## 2018-06-18 DIAGNOSIS — N952 Postmenopausal atrophic vaginitis: Secondary | ICD-10-CM | POA: Diagnosis not present

## 2018-06-18 DIAGNOSIS — Z01419 Encounter for gynecological examination (general) (routine) without abnormal findings: Secondary | ICD-10-CM | POA: Diagnosis not present

## 2018-06-18 NOTE — Patient Instructions (Signed)
Followup for bone density as scheduled. 

## 2018-06-18 NOTE — Progress Notes (Signed)
    Rachel Duffy [birth year] [id number]        67 y.o.  G1P1001 for annual gynecologic exam.  Having some hot flushes and sweats.  Past medical history,surgical history, problem list, medications, allergies, family history and social history were all reviewed and documented as reviewed in the EPIC chart.  ROS:  Performed with pertinent positives and negatives included in the history, assessment and plan.   Additional significant findings : None   Exam: Rachel Duffy assistant Vitals:   06/18/18 1528  BP: 130/84  Weight: 150 lb (68 kg)  Height: 5\' 3"  (1.6 m)   Body mass index is 26.57 kg/m.  General appearance:  Normal affect, orientation and appearance. Skin: Grossly normal HEENT: Without gross lesions.  No cervical or supraclavicular adenopathy. Thyroid normal.  Lungs:  Clear without wheezing, rales or rhonchi Cardiac: RR, without RMG Abdominal:  Soft, nontender, without masses, guarding, rebound, organomegaly or hernia Breasts:  Examined lying and sitting without masses, retractions, discharge or axillary adenopathy. Pelvic:  Ext, BUS, Vagina: With atrophic changes  Cervix: With atrophic changes  Uterus: Anteverted, normal size, shape and contour, midline and mobile nontender   Adnexa: Without masses or tenderness    Anus and perineum: Normal   Rectovaginal: Normal sphincter tone without palpated masses or tenderness.    Assessment/Plan:  67 y.o. G104P1001 female for annual gynecologic exam.   1. Postmenopausal/atrophic genital changes.  Continues to have some hot flushes on and off.  We have discussed this previously and she is not interested in HRT.  Prefers to monitor for now.  Having no bleeding.  Will call if any bleeding or her symptoms worsen that she wants to do rediscuss treatment options. 2. Osteoporosis.  DEXA 2017 T score -3.1.  Started on Actonel due to intolerance of Fosamax.  She is doing okay with this with a little bit of GERD each month but able to take the  medication.  We will go ahead and schedule follow-up DEXA now at 2-year interval.  Refill Actonel x1 year. 3. Mammography coming due in December and I reminded her to schedule this.  Breast exam normal today. 4. Colonoscopy 2018.  Repeat at their recommended interval. 5. Pap smear/HPV 05/2015.  No Pap smear done today.  History of cryosurgery 1980.  Plan repeat Pap smear/HPV at 5-year interval per current screening guidelines. 6. Health maintenance.  No routine lab work done as patient will have this done at her primary physician's office.  Follow-up for DEXA otherwise follow-up in 1 year for annual exam.    Anastasio Auerbach MD, 3:54 PM 06/18/2018

## 2018-06-25 ENCOUNTER — Other Ambulatory Visit: Payer: Self-pay | Admitting: Gynecology

## 2018-08-03 ENCOUNTER — Encounter: Payer: Self-pay | Admitting: Gynecology

## 2018-08-04 ENCOUNTER — Encounter: Payer: Self-pay | Admitting: Gynecology

## 2018-08-28 ENCOUNTER — Emergency Department (HOSPITAL_COMMUNITY)
Admission: EM | Admit: 2018-08-28 | Discharge: 2018-08-29 | Discharge status: Against Medical Advice | Payer: Medicare Other | Attending: Emergency Medicine | Admitting: Emergency Medicine

## 2018-08-28 ENCOUNTER — Encounter (HOSPITAL_COMMUNITY): Payer: Self-pay | Admitting: Emergency Medicine

## 2018-08-28 DIAGNOSIS — I10 Essential (primary) hypertension: Secondary | ICD-10-CM | POA: Diagnosis present

## 2018-08-28 DIAGNOSIS — Z5321 Procedure and treatment not carried out due to patient leaving prior to being seen by health care provider: Secondary | ICD-10-CM | POA: Diagnosis not present

## 2018-08-28 MED ORDER — TETRACAINE HCL 0.5 % OP SOLN: 2.0000 [drp] | Freq: Once | OPHTHALMIC | Status: DC

## 2018-08-28 MED ORDER — FLUORESCEIN SODIUM 1 MG OP STRP: 1.0000 | ORAL_STRIP | Freq: Once | OPHTHALMIC | Status: DC

## 2018-08-28 NOTE — ED Triage Notes (Signed)
Pt reports she had a blood "vescle burst in my right eye.":  Pt went to the fire department, 220/110, there were advised to come to ED.  Blood pressure was 163/97.  Reports she feels "fine."  Pt is going to make an appointment for follow up next week.  No neuro defects.

## 2018-08-29 NOTE — ED Notes (Signed)
Called to recheck vitals. No answer

## 2018-10-01 ENCOUNTER — Ambulatory Visit: Payer: Medicare Other | Admitting: Neurology

## 2018-10-01 ENCOUNTER — Encounter: Payer: Self-pay | Admitting: Neurology

## 2018-10-01 DIAGNOSIS — D329 Benign neoplasm of meninges, unspecified: Secondary | ICD-10-CM | POA: Diagnosis not present

## 2018-10-01 NOTE — Progress Notes (Signed)
Reason for visit: Possible meningioma  Referring physician: Dr. Delanna Ahmadi Rachel Duffy is a 68 y.o. female  History of present illness:  Rachel Duffy is a 68 year old black female with a history of hypertension who comes to the office today for an evaluation.  The patient had onset of redness of the sclera of the right eye that occurred on 28 August 2018.  The patient went to the emergency room for this, she was noted to have elevation of blood pressure of 230/130.  Her blood pressure medications have since been altered.  The patient had rapid resolution of the redness of the eye, but when she saw her primary doctor, a MRI of the brain was ordered.  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  The patient therefore is sent to this office for an evaluation.  Fortunately, the patient had a CT scan of the brain after a syncopal event on 08 August 2012.  The ossification was present at that time, the size is unchanged.  The patient reports no headache, dizziness, vision changes, speech or swallowing changes, or any numbness or weakness of the face, arms, legs.  The patient denies any balance changes, or difficulty controlling the bowels or the bladder.  Past Medical History:  Diagnosis Date  . Allergy   . Cervical dysplasia   . Hypertension   . Osteoporosis 2019   T score -2.6 improved from prior study    Past Surgical History:  Procedure Laterality Date  . BREAST BIOPSY     BENIGN  . COLPOSCOPY    . GYNECOLOGIC CRYOSURGERY    . TUBAL LIGATION      Family History  Problem Relation Age of Onset  . Hypertension Mother   . Osteoporosis Mother   . Colon cancer Mother        late 39's  . Hypertension Sister   . Diabetes Sister   . Diabetes Brother   . Hypertension Maternal Uncle   . Colon cancer Maternal Uncle   . Heart attack Father 73  . Prostate cancer Maternal Uncle     Social history:  reports that she has never smoked. She has never used smokeless tobacco. She  reports current alcohol use of about 2.0 standard drinks of alcohol per week. She reports that she does not use drugs.  Medications:  Prior to Admission medications   Medication Sig Start Date End Date Taking? Authorizing Provider  amLODipine (NORVASC) 2.5 MG tablet Take 2.5 mg by mouth daily.   Yes [provider]  aspirin 81 MG tablet Take 81 mg by mouth daily.     Yes [provider]  cetirizine (ZYRTEC) 10 MG tablet Take 10 mg by mouth daily.   Yes [provider]  Cholecalciferol (VITAMIN D3 PO) Take 1,000 Units by mouth daily.   Yes [provider]  fluticasone (FLONASE) 50 MCG/ACT nasal spray Place 2 sprays into both nostrils at bedtime. 05/25/18  Yes [provider]  hydrochlorothiazide (HYDRODIURIL) 25 MG tablet Take 1 tablet (25 mg total) by mouth daily. 07/02/17  Yes Fontaine, Belinda Block, MD  Multiple Vitamin (MULTIVITAMIN WITH MINERALS) TABS Take 1 tablet by mouth daily.   Yes [provider]  potassium chloride SA (K-DUR,KLOR-CON) 20 MEQ tablet Take 1 tablet (20 mEq total) by mouth daily. 06/06/16  Yes Terrance Mass, MD  risedronate (ACTONEL) 150 MG tablet TAKE 1 TABLET BY MOUTH EVERY 30 DAYS WITH WATER ON AN EMPTY STOMACH NOTHING BY  MOUTH OR LIE DOWN FOR NEXT 30 MINUTES Patient taking differently: Take 150 mg by mouth every 30 (thirty) days. First day of each month 06/25/18  Yes Fontaine, Belinda Block, MD      Allergies  Allergen Reactions  . Clarithromycin Rash  . Sulfa Antibiotics Rash    ROS:  Out of a complete 14 system review of symptoms, the patient complains only of the following symptoms, and all other reviewed systems are negative.  High blood pressure  Blood pressure (!) 166/82, pulse 87, height 5\' 3"  (1.6 m), weight 145 lb (65.8 kg).  Physical Exam  General: The patient is alert and cooperative at the time of the examination.  Eyes: Pupils are equal, round, and reactive to light. Discs are flat  bilaterally.  Neck: The neck is supple, no carotid bruits are noted.  Respiratory: The respiratory examination is clear.  Cardiovascular: The cardiovascular examination reveals a regular rate and rhythm, no obvious murmurs or rubs are noted.  Skin: Extremities are without significant edema.  Neurologic Exam  Mental status: The patient is alert and oriented x 3 at the time of the examination. The patient has apparent normal recent and remote memory, with an apparently normal attention span and concentration ability.  Cranial nerves: Facial symmetry is present. There is good sensation of the face to pinprick and soft touch bilaterally. The strength of the facial muscles and the muscles to head turning and shoulder shrug are normal bilaterally. Speech is well enunciated, no aphasia or dysarthria is noted. Extraocular movements are full. Visual fields are full. The tongue is midline, and the patient has symmetric elevation of the soft palate. No obvious hearing deficits are noted.  Motor: The motor testing reveals 5 over 5 strength of all 4 extremities. Good symmetric motor tone is noted throughout.  Sensory: Sensory testing is intact to pinprick, soft touch, vibration sensation, and position sense on all 4 extremities. No evidence of extinction is noted.  Coordination: Cerebellar testing reveals good finger-nose-finger and heel-to-shin bilaterally.  Gait and station: Gait is normal. Tandem gait is normal. Romberg is negative. No drift is seen.  Reflexes: Deep tendon reflexes are symmetric, but are depressed bilaterally. Toes are downgoing bilaterally.   CT brain 08/08/12:   IMPRESSION:  1.  No acute finding. 2.  Small focal ossification along the falx is benign as described above.   Assessment/Plan:  1.  Ossification of the falx, stable  The patient likely has an ossification of the falx that has remained quite stable over time.  There is a possibility this could represent a  stable calcified meningioma.  At any rate, there has been no progression in size over 7 years.  There is no indication for any further evaluation or follow-up.  In 2013, the calcified area was measured at 0.8 cm x 1.7 cm.  The current measurements are 0.8 x 1.6 cm.  Jill Alexanders MD 10/01/2018 7:36 AM  [facility] [address] [place], [place] [address]  Phone [phone] Fax [phone]

## 2019-06-09 ENCOUNTER — Encounter: Payer: Self-pay | Admitting: Gynecology

## 2019-06-18 ENCOUNTER — Other Ambulatory Visit: Payer: Self-pay

## 2019-06-21 ENCOUNTER — Encounter: Payer: Medicare Other | Admitting: Gynecology

## 2019-06-21 ENCOUNTER — Ambulatory Visit (INDEPENDENT_AMBULATORY_CARE_PROVIDER_SITE_OTHER): Payer: Medicare Other | Admitting: Gynecology

## 2019-06-21 ENCOUNTER — Other Ambulatory Visit: Payer: Self-pay

## 2019-06-21 ENCOUNTER — Encounter: Payer: Self-pay | Admitting: Gynecology

## 2019-06-21 VITALS — BP 120/78 | Ht 63.0 in | Wt 145.0 lb

## 2019-06-21 DIAGNOSIS — N952 Postmenopausal atrophic vaginitis: Secondary | ICD-10-CM

## 2019-06-21 DIAGNOSIS — Z01419 Encounter for gynecological examination (general) (routine) without abnormal findings: Secondary | ICD-10-CM

## 2019-06-21 DIAGNOSIS — M810 Age-related osteoporosis without current pathological fracture: Secondary | ICD-10-CM | POA: Diagnosis not present

## 2019-06-21 MED ORDER — RISEDRONATE SODIUM 150 MG PO TABS: 150.0000 mg | ORAL_TABLET | ORAL | 4 refills | Status: DC

## 2019-06-21 NOTE — Progress Notes (Signed)
    Rachel Duffy 1951-08-13 [id number]        68 y.o.  G1P1001 for annual gynecologic exam.  Reports that over the past year she has been started on a cream by her primary provider to help with her hot flushes.  She applies it behind her knees daily.  She is not sure what is in it.  Past medical history,surgical history, problem list, medications, allergies, family history and social history were all reviewed and documented as reviewed in the EPIC chart.  ROS:  Performed with pertinent positives and negatives included in the history, assessment and plan.   Additional significant findings : None   Exam: [name] assistant Vitals:   06/21/19 1031  BP: 120/78  Weight: 145 lb (65.8 kg)  Height: 5\' 3"  (1.6 m)   Body mass index is 25.69 kg/m.  General appearance:  Normal affect, orientation and appearance. Skin: Grossly normal HEENT: Without gross lesions.  No cervical or supraclavicular adenopathy. Thyroid normal.  Lungs:  Clear without wheezing, rales or rhonchi Cardiac: RR, without RMG Abdominal:  Soft, nontender, without masses, guarding, rebound, organomegaly or hernia Breasts:  Examined lying and sitting without masses, retractions, discharge or axillary adenopathy. Pelvic:  Ext, BUS, Vagina: With atrophic changes  Cervix: With atrophic changes.  Pap smear done  Uterus: Anteverted, normal size, shape and contour, midline and mobile nontender   Adnexa: Without masses or tenderness    Anus and perineum: Normal   Rectovaginal: Normal sphincter tone without palpated masses or tenderness.    Assessment/Plan:  69 y.o. [name] female for annual gynecologic exam.   1. Recently started on a cream for her hot flushes by her primary provider.  Unsure what is in it.  She does not think that it has hormones in it.  We discussed the issue of HRT and absorption possibilities to include oral and transdermal such as patches and creams.  Risks of HRT including thrombosis such as stroke  heart attack DVT and an increased risk of breast cancer.  I encouraged the patient to follow-up with her primary provider to find out exactly what is in the cream and if it is hormones then the patient understands she does accept risks with this. 2. Mammography coming due in December and I reminded her to schedule this.  Breast exam normal today. 3. Osteoporosis.  DEXA 2019 T score -2.6 improved from her prior DEXA.  On Actonel since 2017 doing well with this.  Refill x1 year provided.  Plan repeat DEXA next year at 2-year interval.  Plan Actonel use for approximately 5 years and consider a drug-free holiday. 4. Pap smear/HPV 2016.  Pap smear done today.  History of cryosurgery 1980 with normal Pap smears since. 5. Colonoscopy 2018.  Repeat at their recommended interval. 6. Health maintenance.  No routine lab work done as patient does this elsewhere.  Follow-up 1 year, sooner as needed.   Anastasio Auerbach MD, 11:04 AM 06/21/2019

## 2019-06-21 NOTE — Patient Instructions (Signed)
Check with your primary provider to see what is in the cream you are using.  Follow-up in 1 year for annual exam

## 2019-06-21 NOTE — Addendum Note (Signed)
Addended by: Nelva Nay on: 06/21/2019 11:54 AM   Modules accepted: Orders

## 2019-06-22 LAB — PAP IG W/ RFLX HPV ASCU

## 2019-08-03 ENCOUNTER — Other Ambulatory Visit: Payer: Self-pay

## 2019-08-03 DIAGNOSIS — Z20822 Contact with and (suspected) exposure to covid-19: Secondary | ICD-10-CM

## 2019-08-05 LAB — NOVEL CORONAVIRUS, NAA: SARS-CoV-2, NAA: NOT DETECTED

## 2019-08-11 ENCOUNTER — Encounter: Payer: Self-pay | Admitting: Gynecology

## 2020-02-17 ENCOUNTER — Other Ambulatory Visit: Payer: Self-pay

## 2020-02-17 ENCOUNTER — Ambulatory Visit (INDEPENDENT_AMBULATORY_CARE_PROVIDER_SITE_OTHER): Payer: Medicare Other | Admitting: Otolaryngology

## 2020-02-17 ENCOUNTER — Encounter (INDEPENDENT_AMBULATORY_CARE_PROVIDER_SITE_OTHER): Payer: Self-pay | Admitting: Otolaryngology

## 2020-02-17 VITALS — Temp 97.3°F

## 2020-02-17 DIAGNOSIS — J310 Chronic rhinitis: Secondary | ICD-10-CM | POA: Diagnosis not present

## 2020-02-17 DIAGNOSIS — H6123 Impacted cerumen, bilateral: Secondary | ICD-10-CM

## 2020-02-17 NOTE — Progress Notes (Signed)
HPI: Rachel Duffy is a 69 y.o. female who returns today for evaluation of ear wax buildup in both ears.  She also has history of chronic nasal sinus issues and has been on Flonase in the past.  However apparently her right eye pressure was elevated in her eye doctor thought it might be related to [facility] and is stop the Flonase which she has been off of for 2 or 3 weeks and she is seeing him back later to get the pressure checked again before starting the Flonase again..  Past Medical History:  Diagnosis Date   Allergy    Cervical dysplasia    Hypertension    Osteoporosis 2019   T score -2.6 improved from prior study   Past Surgical History:  Procedure Laterality Date   BREAST BIOPSY     BENIGN   COLPOSCOPY     GYNECOLOGIC CRYOSURGERY     TUBAL LIGATION     Social History   Socioeconomic History   Marital status: Single    Spouse name: Not on file   Number of children: Not on file   Years of education: Not on file   Highest education level: Not on file  Occupational History   Not on file  Tobacco Use   Smoking status: Never Smoker   Smokeless tobacco: Never Used  Vaping Use   Vaping Use: Never used  Substance and Sexual Activity   Alcohol use: Yes    Alcohol/week: 7.0 standard drinks    Types: 7 Glasses of wine per week   Drug use: No   Sexual activity: Yes    Birth control/protection: Post-menopausal, Surgical    Comment: 1st intercourse 4 yo-5 partners  Other Topics Concern   Not on file  Social History Narrative   Not on file   Social Determinants of Health   Financial Resource Strain:    Difficulty of Paying Living Expenses:   Food Insecurity:    Worried About Charity fundraiser in the Last Year:    Arboriculturist in the Last Year:   Transportation Needs:    Film/video editor (Medical):    Lack of Transportation (Non-Medical):   Physical Activity:    Days of Exercise per Week:    Minutes of Exercise per Session:    Stress:    Feeling of Stress :   Social Connections:    Frequency of Communication with Friends and Family:    Frequency of Social Gatherings with Friends and Family:    Attends Religious Services:    Active Member of Clubs or Organizations:    Attends Music therapist:    Marital Status:    Family History  Problem Relation Age of Onset   Hypertension Mother    Osteoporosis Mother    Colon cancer Mother        late 40's   Hypertension Sister    Diabetes Sister    Diabetes Brother    Hypertension Maternal Uncle    Colon cancer Maternal Uncle    Heart attack Father 41   Prostate cancer Maternal Uncle    Allergies  Allergen Reactions   Clarithromycin Rash   Sulfa Antibiotics Rash   Prior to Admission medications   Medication Sig Start Date End Date Taking? Authorizing Provider  amLODipine (NORVASC) 2.5 MG tablet Take 2.5 mg by mouth daily.   Yes [provider]  aspirin 81 MG tablet Take 81 mg by mouth daily.     Yes  [provider]  cetirizine (ZYRTEC) 10 MG tablet Take 10 mg by mouth daily.   Yes [provider]  Cholecalciferol (VITAMIN D3 PO) Take 1,000 Units by mouth daily.   Yes [provider]  fluticasone (FLONASE) 50 MCG/ACT nasal spray Place 2 sprays into both nostrils at bedtime. 05/25/18  Yes [provider]  hydrochlorothiazide (HYDRODIURIL) 25 MG tablet Take 1 tablet (25 mg total) by mouth daily. 07/02/17  Yes Fontaine, Belinda Block, MD  Multiple Vitamin (MULTIVITAMIN WITH MINERALS) TABS Take 1 tablet by mouth daily.   Yes [provider]  potassium chloride SA (K-DUR,KLOR-CON) 20 MEQ tablet Take 1 tablet (20 mEq total) by mouth daily. 06/06/16  Yes Terrance Mass, MD  risedronate (ACTONEL) 150 MG tablet Take 1 tablet (150 mg total) by mouth every 30 (thirty) days. First day of each month 06/21/19  Yes Fontaine, Belinda Block, MD     Positive ROS: Otherwise negative  All other  systems have been reviewed and were otherwise negative with the exception of those mentioned in the HPI and as above.  Physical Exam: Constitutional: Alert, well-appearing, no acute distress Ears: External ears without lesions or tenderness.  Both ear canals are obstructed with cerumen that was removed with curettes and suction.  TMs were clear bilaterally. Nasal: External nose without lesions. Septum midline with moderate rhinitis.  No signs of infection.. Clear nasal passages Oral: Lips and gums without lesions. Tongue and palate mucosa without lesions. Posterior oropharynx clear. Neck: No palpable adenopathy or masses Respiratory: Breathing comfortably  Skin: No facial/neck lesions or rash noted.  Cerumen impaction removal  Date/Time: 02/17/2020 8:24 PM Performed by: Rozetta Nunnery, MD Authorized by: Rozetta Nunnery, MD   Consent:    Consent obtained:  Verbal   Consent given by:  Patient   Risks discussed:  Pain and bleeding Procedure details:    Location:  L ear and R ear   Procedure type: curette and suction   Post-procedure details:    Inspection:  TM intact and canal normal   Hearing quality:  Improved   Patient tolerance of procedure:  Tolerated well, no immediate complications Comments:     TMs are clear bilaterally.    Assessment: Bilateral cerumen impactions. Chronic rhinitis  Plan: Prescribed azelastine 0.1% 1 spray twice daily while she is not taking the Flonase.  Also discussed saline rinses.  She has used Nettie pot in the past but did not tolerate this well. Ear canals were cleaned in the office today.  She will follow-up as needed.   Radene Journey, MD

## 2020-06-21 ENCOUNTER — Ambulatory Visit (INDEPENDENT_AMBULATORY_CARE_PROVIDER_SITE_OTHER): Payer: Medicare Other | Admitting: Obstetrics and Gynecology

## 2020-06-21 ENCOUNTER — Other Ambulatory Visit: Payer: Self-pay

## 2020-06-21 ENCOUNTER — Encounter: Payer: Self-pay | Admitting: Obstetrics and Gynecology

## 2020-06-21 VITALS — BP 124/80 | Ht 63.0 in | Wt 149.0 lb

## 2020-06-21 DIAGNOSIS — R3121 Asymptomatic microscopic hematuria: Secondary | ICD-10-CM | POA: Diagnosis not present

## 2020-06-21 DIAGNOSIS — R232 Flushing: Secondary | ICD-10-CM

## 2020-06-21 DIAGNOSIS — Z79890 Hormone replacement therapy (postmenopausal): Secondary | ICD-10-CM

## 2020-06-21 DIAGNOSIS — M810 Age-related osteoporosis without current pathological fracture: Secondary | ICD-10-CM | POA: Diagnosis not present

## 2020-06-21 DIAGNOSIS — Z01419 Encounter for gynecological examination (general) (routine) without abnormal findings: Secondary | ICD-10-CM | POA: Diagnosis not present

## 2020-06-21 MED ORDER — PROGESTERONE MICRONIZED 100 MG PO CAPS: 100.0000 mg | ORAL_CAPSULE | Freq: Every evening | ORAL | 4 refills | Status: DC

## 2020-06-21 MED ORDER — ESTRADIOL 0.5 MG PO TABS: 0.5000 mg | ORAL_TABLET | Freq: Every day | ORAL | 4 refills | Status: DC

## 2020-06-21 NOTE — Progress Notes (Signed)
Rachel Duffy 11-18-1950 [id number]  SUBJECTIVE:  69 y.o. G1P1001 female here for a breast and pelvic exam.  Concerned about worsening hot flashes, although she has pretty much had them since she went through menopause about 21 years ago.  She was prescribed a cream for hot flashes from her primary care provider and she brings in the tube this year.  She only used it initially but did not continue because she was skeptical about using testosterone for hot flash indication.  Considered HRT last year but did not start.  She has no other gynecologic concerns.  Current Outpatient Medications  Medication Sig Dispense Refill  . amLODipine (NORVASC) 2.5 MG tablet Take 2.5 mg by mouth daily.    Marland Kitchen aspirin 81 MG tablet Take 81 mg by mouth daily.      . cetirizine (ZYRTEC) 10 MG tablet Take 10 mg by mouth daily.    . Cholecalciferol (VITAMIN D3 PO) Take 1,000 Units by mouth daily.    . hydrochlorothiazide (HYDRODIURIL) 25 MG tablet Take 1 tablet (25 mg total) by mouth daily. 30 tablet 1  . Multiple Vitamin (MULTIVITAMIN WITH MINERALS) TABS Take 1 tablet by mouth daily.    . potassium chloride SA (K-DUR,KLOR-CON) 20 MEQ tablet Take 1 tablet (20 mEq total) by mouth daily. 7 tablet 0  . risedronate (ACTONEL) 150 MG tablet Take 1 tablet (150 mg total) by mouth every 30 (thirty) days. First day of each month 3 tablet 4  . fluticasone (FLONASE) 50 MCG/ACT nasal spray Place 2 sprays into both nostrils at bedtime. (Patient not taking: Reported on 06/21/2020)  0   Current Facility-Administered Medications  Medication Dose Route Frequency Provider Last Rate Last Admin  . 0.9 %  sodium chloride infusion  500 mL Intravenous Continuous Nelida Meuse III, MD       Allergies: Clarithromycin and Sulfa antibiotics  No LMP recorded. Patient is postmenopausal.  Past medical history,surgical history, problem list, medications, allergies, family history and social history were all reviewed and documented as reviewed in  the EPIC chart.  GYN ROS: no abnormal bleeding, pelvic pain or discharge, no breast pain or new or enlarging lumps on self exam.  No dysuria, urinary frequency, pain with urination, cloudy/malodorous urine.   OBJECTIVE:  BP 124/80   Ht 5\' 3"  (1.6 m)   Wt 149 lb (67.6 kg)   BMI 26.39 kg/m  The patient appears well, alert, oriented, in no distress.   BREAST EXAM: breasts appear normal, no suspicious masses, no skin or nipple changes or axillary nodes  PELVIC EXAM: VULVA: normal appearing vulva with atrophic change, no masses, tenderness or lesions, VAGINA: normal appearing vagina with atrophic change, normal color and discharge, no lesions, CERVIX: normal appearing atrophic cervix without discharge or lesions, UTERUS: uterus is normal size, shape, consistency and nontender, ADNEXA: normal adnexa in size, nontender and no masses  Chaperone: [name] present during the examination  ASSESSMENT:  69 y.o. G1P1001 here for a breast and pelvic exam  PLAN:   1. Postmenopausal vasomotor symptoms.  She says her worsening hot flashes do continue.  We reviewed that the cream that she was given is a testosterone cream and although it can be used for hot flashes this is an off label/not FDA approved use.  Typically we would start with estrogen and progesterone HRT.  She says that when she used an estrogen patch in the past it caused her to retain fluid so she stopped it.  We discussed the risks  of HRT to include thrombotic diseases such as heart attack, stroke, DVT, PE, and the breast cancer issue.  Need to use progestin for endometrial protection to prevent or mission of cancer of the endometrial lining.  She understands these risks but the benefits of potentially alleviating her hot flash symptoms are worth it to her.  We will start her on estradiol 0.5 mg daily and Prometrium 100 mg nightly.  She understands that she is to notify us if any vaginal bleeding develops. 2. Pap smear 06/2019.  History of  cryosurgery in 1980, normal Pap smears since then.  Next Pap smear due 2023 if she were to desire to continue screening at that time, will revisit her thoughts at that point. 3. Mammogram 08/2019.  Normal breast exam today.  She is reminded to schedule an annual mammogram this year when due. 4. Colonoscopy 2018.  She will follow up at the interval recommended by her GI specialist.   5.  Osteoporosis.  DEXA 08/2018.  T score -2.6, this was improved from the prior DEXA.  She has been on Actonel for about 4 years (since 2017) and doing well with this other than GERD symptoms which she preemptively treats with medication prior to her monthly dose.  Plan to repeat DEXA this year at the 2-year interval and may consider drug-free holiday if BMD is stable, particularly as she is starting a low-dose estrogen for other reasons. 6.  History of microscopic hematuria.  She reports this history although we do not have a urinalysis on file since 2017.  Will check UA today. 7. Health maintenance.  No labs today as she normally has these completed elsewhere.  Return annually or sooner, prn.  Joseph Pierini MD 06/21/20

## 2020-06-23 LAB — URINALYSIS, COMPLETE W/RFL CULTURE
Bacteria, UA: NONE SEEN /HPF
Bilirubin Urine: NEGATIVE
Glucose, UA: NEGATIVE
Hyaline Cast: NONE SEEN /LPF
Ketones, ur: NEGATIVE
Leukocyte Esterase: NEGATIVE
Nitrites, Initial: NEGATIVE
Protein, ur: NEGATIVE
Specific Gravity, Urine: 1.015 (ref 1.001–1.03)
pH: 7 (ref 5.0–8.0)

## 2020-06-23 LAB — URINE CULTURE

## 2020-06-23 LAB — CULTURE INDICATED

## 2020-08-15 ENCOUNTER — Encounter: Payer: Self-pay | Admitting: Obstetrics and Gynecology

## 2020-08-22 ENCOUNTER — Other Ambulatory Visit: Payer: Self-pay

## 2020-08-22 ENCOUNTER — Other Ambulatory Visit: Payer: Self-pay | Admitting: Obstetrics and Gynecology

## 2020-08-22 ENCOUNTER — Ambulatory Visit (INDEPENDENT_AMBULATORY_CARE_PROVIDER_SITE_OTHER): Payer: Medicare Other

## 2020-08-22 DIAGNOSIS — M8589 Other specified disorders of bone density and structure, multiple sites: Secondary | ICD-10-CM | POA: Diagnosis not present

## 2020-08-22 DIAGNOSIS — M810 Age-related osteoporosis without current pathological fracture: Secondary | ICD-10-CM

## 2020-08-22 DIAGNOSIS — Z01419 Encounter for gynecological examination (general) (routine) without abnormal findings: Secondary | ICD-10-CM

## 2020-08-22 DIAGNOSIS — Z780 Asymptomatic menopausal state: Secondary | ICD-10-CM

## 2020-09-16 DIAGNOSIS — R509 Fever, unspecified: Secondary | ICD-10-CM | POA: Diagnosis not present

## 2020-09-16 DIAGNOSIS — J019 Acute sinusitis, unspecified: Secondary | ICD-10-CM | POA: Diagnosis not present

## 2020-10-26 DIAGNOSIS — H40043 Steroid responder, bilateral: Secondary | ICD-10-CM | POA: Diagnosis not present

## 2020-10-26 DIAGNOSIS — H43813 Vitreous degeneration, bilateral: Secondary | ICD-10-CM | POA: Diagnosis not present

## 2021-03-30 DIAGNOSIS — I10 Essential (primary) hypertension: Secondary | ICD-10-CM | POA: Diagnosis not present

## 2021-03-30 DIAGNOSIS — J309 Allergic rhinitis, unspecified: Secondary | ICD-10-CM | POA: Diagnosis not present

## 2021-03-30 DIAGNOSIS — L309 Dermatitis, unspecified: Secondary | ICD-10-CM | POA: Diagnosis not present

## 2021-03-30 DIAGNOSIS — M810 Age-related osteoporosis without current pathological fracture: Secondary | ICD-10-CM | POA: Diagnosis not present

## 2021-03-30 DIAGNOSIS — R7303 Prediabetes: Secondary | ICD-10-CM | POA: Diagnosis not present

## 2021-04-09 DIAGNOSIS — Z20822 Contact with and (suspected) exposure to covid-19: Secondary | ICD-10-CM | POA: Diagnosis not present

## 2021-05-10 DIAGNOSIS — H40043 Steroid responder, bilateral: Secondary | ICD-10-CM | POA: Diagnosis not present

## 2021-06-19 ENCOUNTER — Other Ambulatory Visit (INDEPENDENT_AMBULATORY_CARE_PROVIDER_SITE_OTHER): Payer: Self-pay | Admitting: Otolaryngology

## 2021-06-19 MED ORDER — AZELASTINE-FLUTICASONE 137-50 MCG/ACT NA SUSP: 1.0000 | Freq: Two times a day (BID) | NASAL | Status: DC

## 2021-06-25 ENCOUNTER — Ambulatory Visit: Payer: Medicare Other | Admitting: Obstetrics and Gynecology

## 2021-06-25 NOTE — Progress Notes (Deleted)
70 y.o. G1P1001 Single {Race/ethnicity:17218} female here for annual exam.    PCP:     No LMP recorded. Patient is postmenopausal.           Sexually active: {yes no:314532}  The current method of family planning is tubal ligation.    Exercising: {yes no:314532}  {types:19826} Smoker:  {YES NO:22349}  Health Maintenance: Pap: 06-21-19 Neg, 06-02-15 Neg:Neg HR HPV, 09-20-11 Neg History of abnormal Pap:  yes, Hx of cryotherapy in 1980, paps normal since. MMG: 08-15-20 3D/Neg/BiRads1 Colonoscopy: 02-24-17;next 5 yrs d/t fam.hx of colon cancer BMD:  08-22-20  Result :Osteopenia;hx Osteoporosis--on Actonel TDaP: 04-23-13 Gardasil:   no HIV: *** Hep C: 04-30-13 Neg Screening Labs:  Hb today: ***, Urine today: ***   reports that she has never smoked. She has never used smokeless tobacco. She reports current alcohol use of about 7.0 standard drinks per week. She reports that she does not use drugs.  Past Medical History:  Diagnosis Date   Allergy    Cervical dysplasia    Hypertension    Osteoporosis 2019   T score -2.6 improved from prior study    Past Surgical History:  Procedure Laterality Date   BREAST BIOPSY     BENIGN   COLPOSCOPY     GYNECOLOGIC CRYOSURGERY     TUBAL LIGATION      Current Outpatient Medications  Medication Sig Dispense Refill   amLODipine (NORVASC) 2.5 MG tablet Take 2.5 mg by mouth daily.     aspirin 81 MG tablet Take 81 mg by mouth daily.       Azelastine-Fluticasone 137-50 MCG/ACT SUSP Place 1 spray into the nose every 12 (twelve) hours. 1 spray twice daily 23 g WHAT PHARMACY   cetirizine (ZYRTEC) 10 MG tablet Take 10 mg by mouth daily.     Cholecalciferol (VITAMIN D3 PO) Take 1,000 Units by mouth daily.     estradiol (ESTRACE) 0.5 MG tablet Take 1 tablet (0.5 mg total) by mouth daily. 90 tablet 4   fluticasone (FLONASE) 50 MCG/ACT nasal spray Place 2 sprays into both nostrils at bedtime. (Patient not taking: Reported on 06/21/2020)  0    hydrochlorothiazide (HYDRODIURIL) 25 MG tablet Take 1 tablet (25 mg total) by mouth daily. 30 tablet 1   Multiple Vitamin (MULTIVITAMIN WITH MINERALS) TABS Take 1 tablet by mouth daily.     potassium chloride SA (K-DUR,KLOR-CON) 20 MEQ tablet Take 1 tablet (20 mEq total) by mouth daily. 7 tablet 0   progesterone (PROMETRIUM) 100 MG capsule Take 1 capsule (100 mg total) by mouth at bedtime. 90 capsule 4   risedronate (ACTONEL) 150 MG tablet Take 1 tablet (150 mg total) by mouth every 30 (thirty) days. First day of each month 3 tablet 4   Current Facility-Administered Medications  Medication Dose Route Frequency Provider Last Rate Last Admin   0.9 %  sodium chloride infusion  500 mL Intravenous Continuous Danis, Kirke Corin, MD        Family History  Problem Relation Age of Onset   Hypertension Mother    Osteoporosis Mother    Colon cancer Mother        late 1's   Hypertension Sister    Diabetes Sister    Diabetes Brother    Hypertension Maternal Uncle    Colon cancer Maternal Uncle    Heart attack Father 45   Prostate cancer Maternal Uncle     Review of Systems  Exam:   There were no vitals taken  for this visit.    General appearance: alert, cooperative and appears stated age Head: normocephalic, without obvious abnormality, atraumatic Neck: no adenopathy, supple, symmetrical, trachea midline and thyroid normal to inspection and palpation Lungs: clear to auscultation bilaterally Breasts: normal appearance, no masses or tenderness, No nipple retraction or dimpling, No nipple discharge or bleeding, No axillary adenopathy Heart: regular rate and rhythm Abdomen: soft, non-tender; no masses, no organomegaly Extremities: extremities normal, atraumatic, no cyanosis or edema Skin: skin color, texture, turgor normal. No rashes or lesions Lymph nodes: cervical, supraclavicular, and axillary nodes normal. Neurologic: grossly normal  Pelvic: External genitalia:  no lesions               No abnormal inguinal nodes palpated.              Urethra:  normal appearing urethra with no masses, tenderness or lesions              Bartholins and Skenes: normal                 Vagina: normal appearing vagina with normal color and discharge, no lesions              Cervix: no lesions              Pap taken: {yes no:314532} Bimanual Exam:  Uterus:  normal size, contour, position, consistency, mobility, non-tender              Adnexa: no mass, fullness, tenderness              Rectal exam: {yes no:314532}.  Confirms.              Anus:  normal sphincter tone, no lesions  Chaperone was present for exam:  ***  Assessment:   Well woman visit with gynecologic exam.   Plan: Mammogram screening discussed. Self breast awareness reviewed. Pap and HR HPV as above. Guidelines for Calcium, Vitamin D, regular exercise program including cardiovascular and weight bearing exercise.   Follow up annually and prn.   Additional counseling given.  {yes [id number]. _______ minutes face to face time of which over 50% was spent in counseling.    After visit summary provided.

## 2021-06-26 ENCOUNTER — Ambulatory Visit: Payer: Medicare Other | Admitting: Obstetrics and Gynecology

## 2021-06-27 ENCOUNTER — Ambulatory Visit (INDEPENDENT_AMBULATORY_CARE_PROVIDER_SITE_OTHER): Payer: Medicare HMO | Admitting: Otolaryngology

## 2021-06-27 ENCOUNTER — Other Ambulatory Visit: Payer: Self-pay

## 2021-06-27 DIAGNOSIS — H6123 Impacted cerumen, bilateral: Secondary | ICD-10-CM | POA: Diagnosis not present

## 2021-06-27 DIAGNOSIS — J310 Chronic rhinitis: Secondary | ICD-10-CM | POA: Diagnosis not present

## 2021-06-27 MED ORDER — AZELASTINE-FLUTICASONE 137-50 MCG/ACT NA SUSP: 1.0000 | Freq: Two times a day (BID) | NASAL | 6 refills | Status: DC

## 2021-06-27 NOTE — Progress Notes (Signed)
HPI: Rachel Duffy is a 70 y.o. female who returns today for evaluation of wax buildup in the ears as well as history of allergies and nasal congestion.  She was unable to tolerate Flonase because it apparently elevated the pressures in her eyes.  She has seen her ophthalmologist recently in was informed that her pressure was normal.  She has been using azelastine nasal spray for allergies and nasal congestion. She has noticed wax coming out of her ears and her ears are partially blocked..  Past Medical History:  Diagnosis Date   Allergy    Cervical dysplasia    Hypertension    Osteoporosis 2019   T score -2.6 improved from prior study   Past Surgical History:  Procedure Laterality Date   BREAST BIOPSY     BENIGN   COLPOSCOPY     GYNECOLOGIC CRYOSURGERY     TUBAL LIGATION     Social History   Socioeconomic History   Marital status: Single    Spouse name: Not on file   Number of children: Not on file   Years of education: Not on file   Highest education level: Not on file  Occupational History   Not on file  Tobacco Use   Smoking status: Never   Smokeless tobacco: Never  Vaping Use   Vaping Use: Never used  Substance and Sexual Activity   Alcohol use: Yes    Alcohol/week: 7.0 standard drinks    Types: 7 Glasses of wine per week   Drug use: No   Sexual activity: Yes    Birth control/protection: Post-menopausal, Surgical    Comment: 1st intercourse 72 yo-5 partners  Other Topics Concern   Not on file  Social History Narrative   Not on file   Social Determinants of Health   Financial Resource Strain: Not on file  Food Insecurity: Not on file  Transportation Needs: Not on file  Physical Activity: Not on file  Stress: Not on file  Social Connections: Not on file   Family History  Problem Relation Age of Onset   Hypertension Mother    Osteoporosis Mother    Colon cancer Mother        late 88's   Hypertension Sister    Diabetes Sister    Diabetes Brother     Hypertension Maternal Uncle    Colon cancer Maternal Uncle    Heart attack Father 4   Prostate cancer Maternal Uncle    Allergies  Allergen Reactions   Clarithromycin Rash   Sulfa Antibiotics Rash   Prior to Admission medications   Medication Sig Start Date End Date Taking? Authorizing Provider  amLODipine (NORVASC) 2.5 MG tablet Take 2.5 mg by mouth daily.    [provider]  aspirin 81 MG tablet Take 81 mg by mouth daily.      [provider]  Azelastine-Fluticasone 137-50 MCG/ACT SUSP Place 1 spray into the nose every 12 (twelve) hours. 1 spray twice daily 06/19/21   Rozetta Nunnery, MD  cetirizine (ZYRTEC) 10 MG tablet Take 10 mg by mouth daily.    [provider]  Cholecalciferol (VITAMIN D3 PO) Take 1,000 Units by mouth daily.    [provider]  estradiol (ESTRACE) 0.5 MG tablet Take 1 tablet (0.5 mg total) by mouth daily. 06/21/20   Joseph Pierini, MD  fluticasone (FLONASE) 50 MCG/ACT nasal spray Place 2 sprays into both nostrils at bedtime. Patient not taking: Reported on 06/21/2020 05/25/18   [provider]  hydrochlorothiazide (HYDRODIURIL) 25 MG tablet Take 1 tablet (25 mg total) by mouth daily. 07/02/17   Fontaine, Belinda Block, MD  Multiple Vitamin (MULTIVITAMIN WITH MINERALS) TABS Take 1 tablet by mouth daily.    [provider]  potassium chloride SA (K-DUR,KLOR-CON) 20 MEQ tablet Take 1 tablet (20 mEq total) by mouth daily. 06/06/16   Terrance Mass, MD  progesterone (PROMETRIUM) 100 MG capsule Take 1 capsule (100 mg total) by mouth at bedtime. 06/21/20   Joseph Pierini, MD  risedronate (ACTONEL) 150 MG tablet Take 1 tablet (150 mg total) by mouth every 30 (thirty) days. First day of each month 06/21/19   Fontaine, Belinda Block, MD     Positive ROS: Otherwise negative  All other systems have been reviewed and were otherwise negative with the exception of those mentioned in the HPI and as above.  Physical  Exam: Constitutional: Alert, well-appearing, no acute distress Ears: External ears without lesions or tenderness. Ear canals are are small bilaterally with a large amount of wax in both ear canals that was cleaned with suction and curettes.  The TMs were clear bilaterally. Nasal: External nose without lesions. Septum with mild deformity and moderate rhinitis.  Both middle meatus regions were clear.  No signs of infection.  No polyps.. Oral: Lips and gums without lesions. Tongue and palate mucosa without lesions. Posterior oropharynx clear. Neck: No palpable adenopathy or masses Respiratory: Breathing comfortably  Skin: No facial/neck lesions or rash noted.  Cerumen impaction removal  Date/Time: 06/27/2021 10:57 AM Performed by: Rozetta Nunnery, MD Authorized by: Rozetta Nunnery, MD   Consent:    Consent obtained:  Verbal   Consent given by:  Patient   Risks discussed:  Pain and bleeding Procedure details:    Location:  L ear and R ear   Procedure type: curette and suction   Post-procedure details:    Inspection:  TM intact and canal normal   Hearing quality:  Improved   Procedure completion:  Tolerated well, no immediate complications Comments:     Mod amount of wax in both ear canals which are small.  TMs were clear bilaterally.  Assessment: Chronic rhinitis. Cerumen impactions.  Plan: Ear canals were cleaned in the office with improved hearing. Recommend use of her azelastine when she is having nasal congestion as well as trying saline nasal rinses.  She has tried the [employer] pot previously but did not like this as she felt like it would lead to a sinus infection.  Suggested trying Xlear brand saline sprays. Discussed with her concerning my retirement and further follow-up with one of the other 2 ENT groups.   Radene Journey, MD

## 2021-06-27 NOTE — Addendum Note (Signed)
Addended by: Melony Overly E on: 06/27/2021 02:11 PM   Modules accepted: Orders

## 2021-06-28 ENCOUNTER — Ambulatory Visit: Payer: Medicare Other | Admitting: Obstetrics and Gynecology

## 2021-07-05 NOTE — Progress Notes (Signed)
70 y.o. G1P1001 Single African American female here for annual exam.    LMP was a long time.  Hot flashes.  Patient did not take HRT last year with Dr. Delilah Shan. Declines tx for this.  She uses herbal treatment.   She stopped Actonel due to bone loss.  She had a tooth extraction.  She did not take the Actonel all last year.  Same partner for 29 years.  PCP:   Antony Contras, MD  No LMP recorded. Patient is postmenopausal.           Sexually active: Yes.    The current method of family planning is post menopausal status.    Exercising: Yes.     walking Smoker:  no  Health Maintenance: Pap: 06-21-19 Neg, 06-02-15 Neg:Neg HR HPV, 09-20-11 Neg History of abnormal Pap:  yes, Hx of cryotherapy in 1980, paps normal since. MMG: 08-15-20 3D/Neg/BiRads1.  She has an appointment scheduled.  Colonoscopy:  02-24-17;next 5 yrs d/t fam.hx of colon cancer BMD:  08-22-20  Result :Osteopenia;hx Osteoporosis--stopped Actonel due to teeth issues TDaP:  04-23-13 Gardasil:   no HIV:   She will do with PCP.  Hep C: 04-30-13 Neg Screening Labs:  PCP.   reports that she has never smoked. She has never used smokeless tobacco. She reports current alcohol use of about 3.0 standard drinks per week. She reports that she does not use drugs.  Past Medical History:  Diagnosis Date   Allergy    Cervical dysplasia    Hypertension    Osteoporosis 2019   T score -2.6 improved from prior study    Past Surgical History:  Procedure Laterality Date   BREAST BIOPSY     BENIGN   COLPOSCOPY     GYNECOLOGIC CRYOSURGERY     TUBAL LIGATION      Current Outpatient Medications  Medication Sig Dispense Refill   amLODipine (NORVASC) 2.5 MG tablet Take 2.5 mg by mouth daily.     amLODipine (NORVASC) 2.5 MG tablet Take 1 tablet by mouth daily.     aspirin 81 MG tablet Take 81 mg by mouth daily.       azelastine (ASTELIN) 0.1 % nasal spray 1 puff in each nostril     Azelastine-Fluticasone 137-50 MCG/ACT SUSP Place 1  spray into the nose every 12 (twelve) hours. 23 g 6   Calcium Citrate-Vitamin D (CITRACAL + D PO) Take 1 tablet by mouth daily.     cetirizine (ZYRTEC) 10 MG tablet Take 10 mg by mouth daily.     Cholecalciferol (VITAMIN D3 PO) Take 1,000 Units by mouth daily.     desoximetasone (TOPICORT) 7.20 % cream 1 application     hydrochlorothiazide (HYDRODIURIL) 25 MG tablet Take 1 tablet (25 mg total) by mouth daily. 30 tablet 1   montelukast (SINGULAIR) 10 MG tablet 1 tablet     Multiple Vitamin (MULTIVITAMIN WITH MINERALS) TABS Take 1 tablet by mouth daily.     OVER THE COUNTER MEDICATION Bonafide Takes 1 tablet bid     potassium chloride SA (K-DUR,KLOR-CON) 20 MEQ tablet Take 1 tablet (20 mEq total) by mouth daily. 7 tablet 0   Current Facility-Administered Medications  Medication Dose Route Frequency Provider Last Rate Last Admin   0.9 %  sodium chloride infusion  500 mL Intravenous Continuous Danis, Kirke Corin, MD        Family History  Problem Relation Age of Onset   Hypertension Mother    Osteoporosis Mother  Colon cancer Mother        late 17's   Hypertension Sister    Diabetes Sister    Diabetes Brother    Hypertension Maternal Uncle    Colon cancer Maternal Uncle    Heart attack Father 64   Prostate cancer Maternal Uncle     Review of Systems  All other systems reviewed and are negative.  Exam:   BP 130/82   Pulse 65   Ht 5\' 3"  (1.6 m)   Wt 148 lb (67.1 kg)   SpO2 100%   BMI 26.22 kg/m     General appearance: alert, cooperative and appears stated age Head: normocephalic, without obvious abnormality, atraumatic Neck: no adenopathy, supple, symmetrical, trachea midline and thyroid normal to inspection and palpation Lungs: clear to auscultation bilaterally Breasts: normal appearance, no masses or tenderness, No nipple retraction or dimpling, No nipple discharge or bleeding, No axillary adenopathy Heart: regular rate and rhythm Abdomen: soft, non-tender; no masses,  no organomegaly Extremities: extremities normal, atraumatic, no cyanosis or edema Skin: skin color, texture, turgor normal. No rashes or lesions Lymph nodes: cervical, supraclavicular, and axillary nodes normal. Neurologic: grossly normal  Pelvic: External genitalia:  no lesions              No abnormal inguinal nodes palpated.              Urethra:  normal appearing urethra with no masses, tenderness or lesions              Bartholins and Skenes: normal                 Vagina: normal appearing vagina with normal color and discharge, no lesions              Cervix: no lesions              Pap taken: yes Bimanual Exam:  Uterus:  normal size, contour, position, consistency, mobility, non-tender              Adnexa: no mass, fullness, tenderness              Rectal exam: yes.  Confirms.              Anus:  normal sphincter tone, no lesions  Chaperone was present for exam:  Estill Bamberg, CMA  Assessment:   Well woman visit with gynecologic exam. Osteopenia.  Menopausal vasomotor symptoms.   Plan: Mammogram screening discussed. Self breast awareness reviewed. Pap and HR HPV as above. Guidelines for Calcium, Vitamin D, regular exercise program including cardiovascular and weight bearing exercise. We discussed her bone density and mild osteopenia.  I recommend a next BMD in 2 years.  BMD reviewed from 2021.   FU in 2 years and prn.  After visit summary provided.   26 min  total time was spent for this patient encounter, including preparation, face-to-face counseling with the patient, coordination of care, and documentation of the encounter.

## 2021-07-09 ENCOUNTER — Other Ambulatory Visit (HOSPITAL_COMMUNITY)
Admission: RE | Admit: 2021-07-09 | Discharge: 2021-07-09 | Disposition: A | Discharge status: Home / Self Care | Payer: Medicare HMO | Source: Ambulatory Visit | Attending: Obstetrics and Gynecology | Admitting: Obstetrics and Gynecology

## 2021-07-09 ENCOUNTER — Encounter: Payer: Self-pay | Admitting: Obstetrics and Gynecology

## 2021-07-09 ENCOUNTER — Ambulatory Visit (INDEPENDENT_AMBULATORY_CARE_PROVIDER_SITE_OTHER): Payer: Medicare HMO | Admitting: Obstetrics and Gynecology

## 2021-07-09 ENCOUNTER — Other Ambulatory Visit: Payer: Self-pay

## 2021-07-09 VITALS — BP 130/82 | HR 65 | Ht 63.0 in | Wt 148.0 lb

## 2021-07-09 DIAGNOSIS — Z01419 Encounter for gynecological examination (general) (routine) without abnormal findings: Secondary | ICD-10-CM | POA: Insufficient documentation

## 2021-07-09 DIAGNOSIS — Z124 Encounter for screening for malignant neoplasm of cervix: Secondary | ICD-10-CM | POA: Diagnosis not present

## 2021-07-09 DIAGNOSIS — Z1151 Encounter for screening for human papillomavirus (HPV): Secondary | ICD-10-CM | POA: Diagnosis not present

## 2021-07-09 DIAGNOSIS — M8580 Other specified disorders of bone density and structure, unspecified site: Secondary | ICD-10-CM

## 2021-07-09 NOTE — Patient Instructions (Signed)

## 2021-07-11 DIAGNOSIS — Z1152 Encounter for screening for COVID-19: Secondary | ICD-10-CM | POA: Diagnosis not present

## 2021-07-11 LAB — CYTOLOGY - PAP
Comment: NEGATIVE
Diagnosis: UNDETERMINED — AB
High risk HPV: NEGATIVE

## 2021-08-14 DIAGNOSIS — R7303 Prediabetes: Secondary | ICD-10-CM | POA: Diagnosis not present

## 2021-08-14 DIAGNOSIS — M810 Age-related osteoporosis without current pathological fracture: Secondary | ICD-10-CM | POA: Diagnosis not present

## 2021-08-14 DIAGNOSIS — I10 Essential (primary) hypertension: Secondary | ICD-10-CM | POA: Diagnosis not present

## 2021-08-16 DIAGNOSIS — L309 Dermatitis, unspecified: Secondary | ICD-10-CM | POA: Diagnosis not present

## 2021-08-16 DIAGNOSIS — R7303 Prediabetes: Secondary | ICD-10-CM | POA: Diagnosis not present

## 2021-08-16 DIAGNOSIS — Z1211 Encounter for screening for malignant neoplasm of colon: Secondary | ICD-10-CM | POA: Diagnosis not present

## 2021-08-16 DIAGNOSIS — I10 Essential (primary) hypertension: Secondary | ICD-10-CM | POA: Diagnosis not present

## 2021-08-16 DIAGNOSIS — M810 Age-related osteoporosis without current pathological fracture: Secondary | ICD-10-CM | POA: Diagnosis not present

## 2021-08-16 DIAGNOSIS — Z0000 Encounter for general adult medical examination without abnormal findings: Secondary | ICD-10-CM | POA: Diagnosis not present

## 2021-08-16 DIAGNOSIS — Z23 Encounter for immunization: Secondary | ICD-10-CM | POA: Diagnosis not present

## 2021-08-16 DIAGNOSIS — J309 Allergic rhinitis, unspecified: Secondary | ICD-10-CM | POA: Diagnosis not present

## 2021-08-16 DIAGNOSIS — Z1389 Encounter for screening for other disorder: Secondary | ICD-10-CM | POA: Diagnosis not present

## 2021-08-30 DIAGNOSIS — Z1231 Encounter for screening mammogram for malignant neoplasm of breast: Secondary | ICD-10-CM | POA: Diagnosis not present

## 2021-09-04 ENCOUNTER — Encounter: Payer: Self-pay | Admitting: Obstetrics and Gynecology

## 2021-11-06 DIAGNOSIS — H40043 Steroid responder, bilateral: Secondary | ICD-10-CM | POA: Diagnosis not present

## 2021-11-06 DIAGNOSIS — H02889 Meibomian gland dysfunction of unspecified eye, unspecified eyelid: Secondary | ICD-10-CM | POA: Diagnosis not present

## 2022-02-14 DIAGNOSIS — J309 Allergic rhinitis, unspecified: Secondary | ICD-10-CM | POA: Diagnosis not present

## 2022-02-14 DIAGNOSIS — R7303 Prediabetes: Secondary | ICD-10-CM | POA: Diagnosis not present

## 2022-02-14 DIAGNOSIS — L309 Dermatitis, unspecified: Secondary | ICD-10-CM | POA: Diagnosis not present

## 2022-02-14 DIAGNOSIS — M810 Age-related osteoporosis without current pathological fracture: Secondary | ICD-10-CM | POA: Diagnosis not present

## 2022-02-14 DIAGNOSIS — I10 Essential (primary) hypertension: Secondary | ICD-10-CM | POA: Diagnosis not present

## 2022-02-28 ENCOUNTER — Encounter: Payer: Self-pay | Admitting: Gastroenterology

## 2022-03-07 ENCOUNTER — Encounter: Payer: Self-pay | Admitting: Gastroenterology

## 2022-04-16 ENCOUNTER — Ambulatory Visit (AMBULATORY_SURGERY_CENTER): Payer: Self-pay | Admitting: *Deleted

## 2022-04-16 VITALS — Ht 63.0 in | Wt 149.6 lb

## 2022-04-16 DIAGNOSIS — Z800 Family history of malignant neoplasm of digestive organs: Secondary | ICD-10-CM

## 2022-04-16 MED ORDER — NA SULFATE-K SULFATE-MG SULF 17.5-3.13-1.6 GM/177ML PO SOLN: 1.0000 | Freq: Once | ORAL | 0 refills | Status: AC

## 2022-04-16 NOTE — Progress Notes (Signed)
No egg or soy allergy known to patient  No issues known to pt with past sedation with any surgeries or procedures Patient denies ever being told they had issues or difficulty with intubation  No FH of Malignant Hyperthermia Pt is not on diet pills Pt is not on home 02  Pt is not on blood thinners  Pt denies issues with constipation  No A fib or A flutter Have any cardiac testing pending--NO Pt instructed to use [email or web address] or GoodRx for a price reduction on prep   

## 2022-05-01 ENCOUNTER — Encounter: Payer: Self-pay | Admitting: Certified Registered Nurse Anesthetist

## 2022-05-08 ENCOUNTER — Ambulatory Visit (AMBULATORY_SURGERY_CENTER): Payer: Medicare PPO | Admitting: Gastroenterology

## 2022-05-08 ENCOUNTER — Encounter: Payer: Self-pay | Admitting: Gastroenterology

## 2022-05-08 VITALS — BP 123/81 | HR 55 | Temp 96.8°F | Resp 16 | Ht 63.0 in | Wt 149.0 lb

## 2022-05-08 DIAGNOSIS — Z09 Encounter for follow-up examination after completed treatment for conditions other than malignant neoplasm: Secondary | ICD-10-CM | POA: Diagnosis not present

## 2022-05-08 DIAGNOSIS — Z800 Family history of malignant neoplasm of digestive organs: Secondary | ICD-10-CM | POA: Diagnosis not present

## 2022-05-08 DIAGNOSIS — Z86010 Personal history of colonic polyps: Secondary | ICD-10-CM

## 2022-05-08 DIAGNOSIS — I10 Essential (primary) hypertension: Secondary | ICD-10-CM | POA: Diagnosis not present

## 2022-05-08 MED ORDER — SODIUM CHLORIDE 0.9 % IV SOLN: 500.0000 mL | Freq: Once | INTRAVENOUS | Status: DC

## 2022-05-08 NOTE — Progress Notes (Signed)
History and Physical:  This patient presents for endoscopic testing for: Encounter Diagnoses  Name Primary?   Family history of malignant neoplasm of gastrointestinal tract Yes   Personal history of colonic polyps     No polyps last colonoscopy April 2018.  TA polyp in 2013 Patient denies chronic abdominal pain, rectal bleeding, constipation or diarrhea.  Patient is otherwise without complaints or active issues today.   Past Medical History: Past Medical History:  Diagnosis Date   Allergy    Cervical dysplasia    Hypertension      Past Surgical History: Past Surgical History:  Procedure Laterality Date   BREAST BIOPSY     BENIGN   COLPOSCOPY     GYNECOLOGIC CRYOSURGERY     DR.WALTER HUGHES   TUBAL LIGATION      Allergies: Allergies  Allergen Reactions   Clarithromycin Rash   Sulfa Antibiotics Rash    Outpatient Meds: Current Outpatient Medications  Medication Sig Dispense Refill   amLODipine (NORVASC) 2.5 MG tablet Take 2.5 mg by mouth daily.     aspirin 81 MG tablet Take 81 mg by mouth daily.       azelastine (ASTELIN) 0.1 % nasal spray 1 puff in each nostril     Azelastine-Fluticasone 137-50 MCG/ACT SUSP Place 1 spray into the nose every 12 (twelve) hours. 23 g 6   Calcium Citrate-Vitamin D (CITRACAL + D PO) Take 1 tablet by mouth daily.     cetirizine (ZYRTEC) 10 MG tablet Take 10 mg by mouth daily.     Cholecalciferol (VITAMIN D3 PO) Take 1,000 Units by mouth daily.     desoximetasone (TOPICORT) 6.22 % cream 1 application     Ginkgo Biloba 120 MG TABS 2 tablets     hydrochlorothiazide (HYDRODIURIL) 25 MG tablet Take 1 tablet (25 mg total) by mouth daily. 30 tablet 1   montelukast (SINGULAIR) 10 MG tablet 1 tablet     Multiple Vitamin (MULTIVITAMIN WITH MINERALS) TABS Take 1 tablet by mouth daily.     OVER THE COUNTER MEDICATION Bonafide Takes 1 tablet bid     potassium chloride SA (K-DUR,KLOR-CON) 20 MEQ tablet Take 1 tablet (20 mEq total) by mouth daily.  7 tablet 0   amLODipine (NORVASC) 2.5 MG tablet Take 1 tablet by mouth daily. (Patient not taking: Reported on 04/16/2022)     Current Facility-Administered Medications  Medication Dose Route Frequency Provider Last Rate Last Admin   0.9 %  sodium chloride infusion  500 mL Intravenous Continuous Danis, Estill Cotta III, MD       0.9 %  sodium chloride infusion  500 mL Intravenous Once Danis, Estill Cotta III, MD          ___________________________________________________________________ Objective   Exam:  BP (!) 179/73   Pulse 62   Temp (!) 96.8 F (36 C) (Temporal)   Ht '5\' 3"'$  (1.6 m)   Wt 149 lb (67.6 kg)   SpO2 100%   BMI 26.39 kg/m   CV: RRR without murmur, S1/S2 Resp: clear to auscultation bilaterally, normal RR and effort noted GI: soft, no tenderness, with active bowel sounds.   Assessment: Encounter Diagnoses  Name Primary?   Family history of malignant neoplasm of gastrointestinal tract Yes   Personal history of colonic polyps      Plan: Colonoscopy  The benefits and risks of the planned procedure were described in detail with the patient or (when appropriate) their health care proxy.  Risks were outlined as including, but not  limited to, bleeding, infection, perforation, adverse medication reaction leading to cardiac or pulmonary decompensation, pancreatitis (if ERCP).  The limitation of incomplete mucosal visualization was also discussed.  No guarantees or warranties were given.    The patient is appropriate for an endoscopic procedure in the ambulatory setting.   - Wilfrid Lund, MD

## 2022-05-08 NOTE — Patient Instructions (Signed)
Please read handouts provided. Continue present medications. Repeat colonoscopy in 5 years for screening.   YOU HAD AN ENDOSCOPIC PROCEDURE TODAY AT [facility]:   Refer to the procedure report that was given to you for any specific questions about what was found during the examination.  If the procedure report does not answer your questions, please call your gastroenterologist to clarify.  If you requested that your care partner not be given the details of your procedure findings, then the procedure report has been included in a sealed envelope for you to review at your convenience later.  YOU SHOULD EXPECT: Some feelings of bloating in the abdomen. Passage of more gas than usual.  Walking can help get rid of the air that was put into your GI tract during the procedure and reduce the bloating. If you had a lower endoscopy (such as a colonoscopy or flexible sigmoidoscopy) you may notice spotting of blood in your stool or on the toilet paper. If you underwent a bowel prep for your procedure, you may not have a normal bowel movement for a few days.  Please Note:  You might notice some irritation and congestion in your nose or some drainage.  This is from the oxygen used during your procedure.  There is no need for concern and it should clear up in a day or so.  SYMPTOMS TO REPORT IMMEDIATELY:  Following lower endoscopy (colonoscopy or flexible sigmoidoscopy):  Excessive amounts of blood in the stool  Significant tenderness or worsening of abdominal pains  Swelling of the abdomen that is new, acute  Fever of 100F or higher.  For urgent or emergent issues, a gastroenterologist can be reached at any hour by calling [phone]. Do not use MyChart messaging for urgent concerns.    DIET:  We do recommend a small meal at first, but then you may proceed to your regular diet.  Drink plenty of fluids but you should avoid alcoholic beverages for 24 hours.  ACTIVITY:  You should plan  to take it easy for the rest of today and you should NOT DRIVE or use heavy machinery until tomorrow (because of the sedation medicines used during the test).    FOLLOW UP: Our staff will call the number listed on your records the next business day following your procedure.  We will call around 7:15- 8:00 am to check on you and address any questions or concerns that you may have regarding the information given to you following your procedure. If we do not reach you, we will leave a message.  If you develop any symptoms (ie: fever, flu-like symptoms, shortness of breath, cough etc.) before then, please call [phone].  If you test positive for Covid 19 in the 2 weeks post procedure, please call and report this information to [place].    If any biopsies were taken you will be contacted by phone or by letter within the next 1-3 weeks.  Please call us at [phone] if you have not heard about the biopsies in 3 weeks.    SIGNATURES/CONFIDENTIALITY: You and/or your care partner have signed paperwork which will be entered into your electronic medical record.  These signatures attest to the fact that that the information above on your After Visit Summary has been reviewed and is understood.  Full responsibility of the confidentiality of this discharge information lies with you and/or your care-partner.

## 2022-05-08 NOTE — Progress Notes (Signed)
Report given to PACU, vss 

## 2022-05-08 NOTE — Progress Notes (Signed)
Pt's states no medical or surgical changes since previsit or office visit. 

## 2022-05-08 NOTE — Op Note (Signed)
[facility] Patient Name: Rachel Duffy Procedure Date: 05/08/2022 9:53 AM MRN: [id number] Endoscopist: Mallie Mussel L. Loletha Carrow , MD Age: 71 Referring MD:  Date of Birth: 1951/05/31 Gender: Female Account #: [id number] Procedure:                Colonoscopy Indications:              Colon cancer screening in patient at increased                            risk: Colorectal cancer in mother, Surveillance:                            Personal history of adenomatous polyps on last                            colonoscopy 5 years ago                           TA polyp in 2013; no polyps April 2018 Medicines:                Monitored Anesthesia Care Procedure:                Pre-Anesthesia Assessment:                           - Prior to the procedure, a History and Physical                            was performed, and patient medications and                            allergies were reviewed. The patient's tolerance of                            previous anesthesia was also reviewed. The risks                            and benefits of the procedure and the sedation                            options and risks were discussed with the patient.                            All questions were answered, and informed consent                            was obtained. Prior Anticoagulants: The patient has                            taken no previous anticoagulant or antiplatelet                            agents. ASA Grade Assessment: II - A patient with  mild systemic disease. After reviewing the risks                            and benefits, the patient was deemed in                            satisfactory condition to undergo the procedure.                           After obtaining informed consent, the colonoscope                            was passed under direct vision. Throughout the                            procedure, the patient's blood pressure, pulse, and                             oxygen saturations were monitored continuously. The                            Olympus CF-HQ190L [id number] [id number]) Colonoscope was                            introduced through the anus and advanced to the the                            cecum, identified by appendiceal orifice and                            ileocecal valve. The colonoscopy was performed                            without difficulty. The patient tolerated the                            procedure well. The quality of the bowel                            preparation was excellent. The ileocecal valve,                            appendiceal orifice, and rectum were photographed. Scope In: 10:15:47 AM Scope Out: 10:26:38 AM Scope Withdrawal Time: 0 hours 7 minutes 32 seconds  Total Procedure Duration: 0 hours 10 minutes 51 seconds  Findings:                 The perianal and digital rectal examinations were                            normal.                           The entire examined colon appeared normal on direct  and retroflexion views. Complications:            No immediate complications. Estimated Blood Loss:     Estimated blood loss: none. Impression:               - The entire examined colon is normal on direct and                            retroflexion views.                           - No specimens collected. Recommendation:           - Patient has a contact number available for                            emergencies. The signs and symptoms of potential                            delayed complications were discussed with the                            patient. Return to normal activities tomorrow.                            Written discharge instructions were provided to the                            patient.                           - Resume previous diet.                           - Continue present medications.                           - Repeat colonoscopy in 5 years for  screening                            purposes. Merton Wadlow L. Loletha Carrow, MD 05/08/2022 10:29:26 AM This report has been signed electronically.

## 2022-05-09 ENCOUNTER — Telehealth: Payer: Self-pay | Admitting: *Deleted

## 2022-05-09 NOTE — Telephone Encounter (Signed)
No answer for post procedure follow up call. LEft VM.

## 2022-05-28 DIAGNOSIS — Z936 Other artificial openings of urinary tract status: Secondary | ICD-10-CM | POA: Diagnosis not present

## 2022-09-03 DIAGNOSIS — Z1231 Encounter for screening mammogram for malignant neoplasm of breast: Secondary | ICD-10-CM | POA: Diagnosis not present

## 2022-09-05 ENCOUNTER — Encounter: Payer: Self-pay | Admitting: Obstetrics and Gynecology

## 2022-09-23 DIAGNOSIS — I10 Essential (primary) hypertension: Secondary | ICD-10-CM | POA: Diagnosis not present

## 2022-09-23 DIAGNOSIS — J309 Allergic rhinitis, unspecified: Secondary | ICD-10-CM | POA: Diagnosis not present

## 2022-09-23 DIAGNOSIS — J069 Acute upper respiratory infection, unspecified: Secondary | ICD-10-CM | POA: Diagnosis not present

## 2022-09-23 DIAGNOSIS — M810 Age-related osteoporosis without current pathological fracture: Secondary | ICD-10-CM | POA: Diagnosis not present

## 2022-09-23 DIAGNOSIS — L309 Dermatitis, unspecified: Secondary | ICD-10-CM | POA: Diagnosis not present

## 2022-09-23 DIAGNOSIS — R7303 Prediabetes: Secondary | ICD-10-CM | POA: Diagnosis not present

## 2022-10-29 DIAGNOSIS — Z1331 Encounter for screening for depression: Secondary | ICD-10-CM | POA: Diagnosis not present

## 2022-10-29 DIAGNOSIS — J309 Allergic rhinitis, unspecified: Secondary | ICD-10-CM | POA: Diagnosis not present

## 2022-10-29 DIAGNOSIS — Z209 Contact with and (suspected) exposure to unspecified communicable disease: Secondary | ICD-10-CM | POA: Diagnosis not present

## 2022-10-29 DIAGNOSIS — I10 Essential (primary) hypertension: Secondary | ICD-10-CM | POA: Diagnosis not present

## 2022-10-29 DIAGNOSIS — Z1211 Encounter for screening for malignant neoplasm of colon: Secondary | ICD-10-CM | POA: Diagnosis not present

## 2022-10-29 DIAGNOSIS — M810 Age-related osteoporosis without current pathological fracture: Secondary | ICD-10-CM | POA: Diagnosis not present

## 2022-10-29 DIAGNOSIS — Z0000 Encounter for general adult medical examination without abnormal findings: Secondary | ICD-10-CM | POA: Diagnosis not present

## 2022-10-29 DIAGNOSIS — R7303 Prediabetes: Secondary | ICD-10-CM | POA: Diagnosis not present

## 2022-10-29 DIAGNOSIS — L309 Dermatitis, unspecified: Secondary | ICD-10-CM | POA: Diagnosis not present

## 2022-10-31 DIAGNOSIS — H00014 Hordeolum externum left upper eyelid: Secondary | ICD-10-CM | POA: Diagnosis not present

## 2022-12-02 DIAGNOSIS — H00024 Hordeolum internum left upper eyelid: Secondary | ICD-10-CM | POA: Diagnosis not present

## 2022-12-23 DIAGNOSIS — H00024 Hordeolum internum left upper eyelid: Secondary | ICD-10-CM | POA: Diagnosis not present

## 2022-12-23 DIAGNOSIS — H40053 Ocular hypertension, bilateral: Secondary | ICD-10-CM | POA: Diagnosis not present

## 2022-12-25 ENCOUNTER — Encounter: Payer: Self-pay | Admitting: Allergy

## 2022-12-25 ENCOUNTER — Ambulatory Visit: Payer: Medicare PPO | Admitting: Allergy

## 2022-12-25 VITALS — BP 140/62 | HR 77 | Temp 98.3°F | Resp 18 | Ht 62.0 in | Wt 150.0 lb

## 2022-12-25 DIAGNOSIS — H6123 Impacted cerumen, bilateral: Secondary | ICD-10-CM

## 2022-12-25 DIAGNOSIS — T783XXD Angioneurotic edema, subsequent encounter: Secondary | ICD-10-CM

## 2022-12-25 DIAGNOSIS — J3089 Other allergic rhinitis: Secondary | ICD-10-CM

## 2022-12-25 DIAGNOSIS — H1013 Acute atopic conjunctivitis, bilateral: Secondary | ICD-10-CM

## 2022-12-25 MED ORDER — LEVOCETIRIZINE DIHYDROCHLORIDE 5 MG PO TABS: 5.0000 mg | ORAL_TABLET | Freq: Every evening | ORAL | 5 refills | Status: AC

## 2022-12-25 MED ORDER — IPRATROPIUM BROMIDE 0.06 % NA SOLN: 2.0000 | Freq: Three times a day (TID) | NASAL | 5 refills | Status: DC

## 2022-12-25 NOTE — Progress Notes (Addendum)
New Patient Note  RE: [name] MRN: [id number] DOB: [birth year] Date of Office Visit: 12/25/2022   Primary care provider: Tally Joe, MD  Chief Complaint: allergies  History of present illness: Rachel Duffy is a 72 y.o. female presenting today for evaluation of allergic rhinitis.    She has allergic rhinitis and states she was positive to "everything" when she last had testing done many years ago. She states she did allergy shots for about a year and didn't see any improvements thus she stopped.  Back then she was seeing [name] as her allergist, who retired many years ago.  She states she has been on the same allergy medications this entire time and seems to not be working well at this time.  She has allergy symptoms all year but worse in the pollen season.  She has nasal congestion/drainage, itchy nose, itchy/watery eyes, sneezing.    She states she was recommended to use Flonase but this raised her eye pressure. However she was changed to generic Dymista which still contains fluticasone which is what is in Flonase.  She does note having an episode where she has blood in the mucus after blowing nose.  She has been taking Zyrtec for years.  She also is taking Singulair.   She states when the pollen goes down she doesn't have to continue on Singulair thus she has been using this more as needed.  She has tried Insurance underwriter which didn't work for her.   She has taken sudafed when needed as well.   No history of asthma.  She thinks she does have eczema on hand related to dawn dish soap.  She states with using the new Dawn formulation she develop itchy rash on her hands that she still has hyperpigmentation from. She states she has some cream to apply but does not recall the name.  She states [name] told her she was allergic to a lot of foods.   She has her old testing at home.  She states the foods she currently avoids include the following: she states she is allergic  to canteloupe (lip swelling).    sometimes she can have lip swelling with shrimp but states would not cause symptoms every time.  Thus she avoids.   she avoids sesame seeds due to lip burning/swelling.   She does not have an epipen and states was prescribed by [name] but she didn't want it and thus never picked it up from pharmacy.  Review of systems: Review of Systems  Constitutional: Negative.   HENT:         See HPI  Eyes:        See HPI  Respiratory: Negative.    Cardiovascular: Negative.   Gastrointestinal: Negative.   Musculoskeletal: Negative.   Skin: Negative.   Allergic/Immunologic: Negative.   Neurological: Negative.     All other systems negative unless noted above in HPI  Past medical history: Past Medical History:  Diagnosis Date   Allergy    Angio-edema    Cervical dysplasia    Eczema    Hypertension     Past surgical history: Past Surgical History:  Procedure Laterality Date   BREAST BIOPSY     BENIGN   COLPOSCOPY     GYNECOLOGIC CRYOSURGERY     DR.WALTER HUGHES   TUBAL LIGATION      Family history:  Family History  Problem Relation Age of Onset   Hypertension Mother  Osteoporosis Mother    Colon cancer Mother        late 34's   Heart attack Father 1   Urticaria Sister    Colon polyps Sister    Hypertension Sister    Diabetes Sister    Asthma Brother    Diabetes Brother    Hypertension Maternal Uncle    Colon cancer Maternal Uncle    Prostate cancer Maternal Uncle    Crohn's disease Neg Hx    Esophageal cancer Neg Hx    Rectal cancer Neg Hx    Stomach cancer Neg Hx    Allergic rhinitis Neg Hx    Eczema Neg Hx     Social history: Lives in a townhome with gas, electric and heat pump heating.  There is concern for roaches in the home.  Does not report an occupation or if retired at this time.  Does not report a smoking history.    Medication List: Current Outpatient Medications  Medication Sig Dispense Refill   amLODipine  (NORVASC) 2.5 MG tablet Take 2.5 mg by mouth daily.     aspirin 81 MG tablet Take 81 mg by mouth daily.       azithromycin (ZITHROMAX) 250 MG tablet Take 250 mg by mouth as directed.     Calcium Citrate-Vitamin D (CITRACAL + D PO) Take 1 tablet by mouth daily.     Cholecalciferol (VITAMIN D3 PO) Take 1,000 Units by mouth daily.     desoximetasone (TOPICORT) 0.25 % cream 1 application     doxycycline (ADOXA) 100 MG tablet Take 100 mg by mouth 2 (two) times daily.     hydrochlorothiazide (HYDRODIURIL) 25 MG tablet Take 1 tablet (25 mg total) by mouth daily. 30 tablet 1   ipratropium (ATROVENT) 0.06 % nasal spray Place 2 sprays into both nostrils 3 (three) times daily. 15 mL 5   levocetirizine (XYZAL) 5 MG tablet Take 1 tablet (5 mg total) by mouth every evening. 30 tablet 5   montelukast (SINGULAIR) 10 MG tablet 1 tablet     Multiple Vitamin (MULTIVITAMIN WITH MINERALS) TABS Take 1 tablet by mouth daily.     OVER THE COUNTER MEDICATION Bonafide Takes 1 tablet bid     Potassium Chloride ER 20 MEQ TBCR Take by mouth.     potassium chloride SA (K-DUR,KLOR-CON) 20 MEQ tablet Take 1 tablet (20 mEq total) by mouth daily. 7 tablet 0   TOBRADEX ophthalmic ointment Place 1 Application into the left eye every 4 (four) hours while awake.     Current Facility-Administered Medications  Medication Dose Route Frequency Provider Last Rate Last Admin   0.9 %  sodium chloride infusion  500 mL Intravenous Continuous Danis, Andreas Blower, MD        Known medication allergies: Allergies  Allergen Reactions   Clarithromycin Rash   Sulfa Antibiotics Rash     Physical examination: Blood pressure (!) 140/62, pulse 77, temperature 98.3 F (36.8 C), temperature source Temporal, resp. rate 18, height 5\' 2"  (1.575 m), weight 150 lb (68 kg), SpO2 99 %.  General: Alert, interactive, in no acute distress. HEENT: PERRLA, cerumen impaction bilaterally unable to visualize TM, turbinates moderately edematous with clear  discharge, post-pharynx non erythematous. Neck: Supple without lymphadenopathy. Lungs: Clear to auscultation without wheezing, rhonchi or rales. {no increased work of breathing. CV: Normal S1, S2 without murmurs. Abdomen: Nondistended, nontender. Skin: Hands over knuckle areas with mild hyperpigmented patches . Extremities:  No clubbing, cyanosis or edema. Neuro:  Grossly intact.  Diagnositics/Labs: None today.  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   Azelastine-Fluticasone as the Fluticasone is a steroid which can increase eye pressure and also can cause nosebleeds - Continue with: Singulair (montelukast)  daily during pollen season - Start taking:  Xyzal  daily.  This is a long-acting antihistamine to replace Zyrtec at this time.  Sample provided.  Atrovent (ipratropium) 0.06% one spray per nostril 3-4 times daily as needed for runny nose or congestion.  Atrovent works best for runny nose and may work for congestion.   Pataday 1 drop each eye daily as needed for itchy/watery eyes.  Sample provided.  - You can use an extra dose of the antihistamine, if needed, for breakthrough symptoms.   - Will obtain IgE levels for shellfish, canteloupe and sesame. Continue avoidance of these foods.  Would recommend access to epipen in case of more severe reaction symptoms if accidentally ingested.    - Contact allergy/dermatitis related to [employer].  Continue to avoid contact with skin.   -Recommend ear flushing irrigation to remove cerumen.  She does mention she used to have her ENT Dr. Ezzard Standing flush out her ear at least once a year.  Dr. Ezzard Standing has retired.  Will place a new referral for ENT.   Bring your old  testing from [name] so I can review Follow-up in 4-6 months or sooner if needed  I appreciate the opportunity to take part in Victora's care. Please do not hesitate to contact me with questions.  Sincerely,   Margo Aye, MD Allergy/Immunology [facility]

## 2022-12-25 NOTE — Patient Instructions (Addendum)
-   Will update your allergy testing with blood work for environmental allergens - Stop taking:  Zyrtec as likely not effective anymore.   Azelastine-Fluticasone as the Fluticasone is a steroid which can increase eye pressure and also can cause nosebleeds - Continue with: Singulair (montelukast)  daily during pollen season - Start taking:  Xyzal  daily.  This is a long-acting antihistamine to replace Zyrtec at this time.  Sample provided.  Atrovent (ipratropium) 0.06% one spray per nostril 3-4 times daily as needed for runny nose or congestion.  Atrovent works best for runny nose and may work for congestion.   Pataday 1 drop each eye daily as needed for itchy/watery eyes.  Sample provided.  - You can use an extra dose of the antihistamine, if needed, for breakthrough symptoms.   - Will obtain IgE levels for shellfish, canteloupe and sesame. Continue avoidance of these foods.  Would recommend access to epipen in case of more severe reaction symptoms if accidentally ingested.    - Contact allergy/dermatitis related to [employer].  Continue to avoid contact with skin.   Bring your old testing from [name] so I can review Follow-up in 4-6 months or sooner if needed

## 2022-12-30 LAB — ALLERGENS W/TOTAL IGE AREA 2
Alternaria Alternata IgE: <0.1 kU/L
Aspergillus Fumigatus IgE: <0.1 kU/L
Bermuda Grass IgE: 19 kU/L — AB
Cat Dander IgE: 2.01 kU/L — AB
Cedar, Mountain IgE: 16.2 kU/L — AB
Cladosporium Herbarum IgE: <0.1 kU/L
Cockroach, German IgE: 0.77 kU/L — AB
Common Silver Birch IgE: 59 kU/L — AB
Cottonwood IgE: 2.32 kU/L — AB
D Farinae IgE: 0.51 kU/L — AB
D Pteronyssinus IgE: 0.48 kU/L — AB
Dog Dander IgE: 0.76 kU/L — AB
Elm, American IgE: 11.7 kU/L — AB
IgE (Immunoglobulin E), Serum: 1445 IU/mL — ABNORMAL HIGH (ref 6–495)
Johnson Grass IgE: 16.5 kU/L — AB
Maple/Box Elder IgE: 2.39 kU/L — AB
Mouse Urine IgE: <0.1 kU/L
Oak, White IgE: 52 kU/L — AB
Pecan, Hickory IgE: 4.35 kU/L — AB
Penicillium Chrysogen IgE: 0.59 kU/L — AB
Pigweed, Rough IgE: 1.36 kU/L — AB
Ragweed, Short IgE: 11.1 kU/L — AB
Sheep Sorrel IgE Qn: 1.41 kU/L — AB
Timothy Grass IgE: 23.8 kU/L — AB
White Mulberry IgE: 1.26 kU/L — AB

## 2022-12-30 LAB — ALLERGEN PROFILE, SHELLFISH
Clam IgE: 0.4 kU/L — AB
F023-IgE Crab: 0.56 kU/L — AB
F080-IgE Lobster: 0.26 kU/L — AB
F290-IgE Oyster: 0.72 kU/L — AB
Scallop IgE: 0.74 kU/L — AB
Shrimp IgE: 1.09 kU/L — AB

## 2022-12-30 LAB — ALLERGEN CANTALOUPE: Allergen Melon IgE: 2.26 kU/L — AB

## 2022-12-30 LAB — ALLERGEN SESAME F10: Sesame Seed IgE: 4.15 kU/L — AB

## 2023-01-02 ENCOUNTER — Telehealth: Payer: Self-pay

## 2023-01-02 NOTE — Telephone Encounter (Signed)
Patient is scheduled to see Dr Suszanne Conners on 01/13/2023 @ 2:10 PM. Patient has been informed of this information.  [address] [place] [address] Phone: [phone] Fax: [phone]  FYI: ** She did want to let Dr Delorse Lek know that she tried to take the levocetirizine but she believes it made her Blood Pressure increase. Patient started back on the zyrtec. She states she has been feeling better since starting it back. **

## 2023-01-02 NOTE — Telephone Encounter (Signed)
-----   Message from [facility] Larose Hires, MD sent at 12/25/2022  1:14 PM EDT ----- Please place an ENT referral for cerumen impaction.  Patient used to see ENT, Dr. Ezzard Standing who has retired.

## 2023-01-08 ENCOUNTER — Telehealth: Payer: Self-pay | Admitting: Allergy

## 2023-01-08 NOTE — Telephone Encounter (Signed)
Spoke to patient about lab results. Patient stated that she understood. She did not want to do the in office challenge at this time.

## 2023-01-08 NOTE — Telephone Encounter (Signed)
Patient was calling about her labs [phone]

## 2023-01-13 DIAGNOSIS — H903 Sensorineural hearing loss, bilateral: Secondary | ICD-10-CM | POA: Diagnosis not present

## 2023-01-13 DIAGNOSIS — H6123 Impacted cerumen, bilateral: Secondary | ICD-10-CM | POA: Diagnosis not present

## 2023-01-30 DIAGNOSIS — H35013 Changes in retinal vascular appearance, bilateral: Secondary | ICD-10-CM | POA: Diagnosis not present

## 2023-01-30 DIAGNOSIS — H35033 Hypertensive retinopathy, bilateral: Secondary | ICD-10-CM | POA: Diagnosis not present

## 2023-01-30 DIAGNOSIS — H524 Presbyopia: Secondary | ICD-10-CM | POA: Diagnosis not present

## 2023-01-30 DIAGNOSIS — H40053 Ocular hypertension, bilateral: Secondary | ICD-10-CM | POA: Diagnosis not present

## 2023-01-30 DIAGNOSIS — H2513 Age-related nuclear cataract, bilateral: Secondary | ICD-10-CM | POA: Diagnosis not present

## 2023-02-13 DIAGNOSIS — H1131 Conjunctival hemorrhage, right eye: Secondary | ICD-10-CM | POA: Diagnosis not present

## 2023-02-13 DIAGNOSIS — R030 Elevated blood-pressure reading, without diagnosis of hypertension: Secondary | ICD-10-CM | POA: Diagnosis not present

## 2023-04-07 DIAGNOSIS — Z111 Encounter for screening for respiratory tuberculosis: Secondary | ICD-10-CM | POA: Diagnosis not present

## 2023-04-09 DIAGNOSIS — Z111 Encounter for screening for respiratory tuberculosis: Secondary | ICD-10-CM | POA: Diagnosis not present

## 2023-04-29 DIAGNOSIS — M810 Age-related osteoporosis without current pathological fracture: Secondary | ICD-10-CM | POA: Diagnosis not present

## 2023-04-29 DIAGNOSIS — J309 Allergic rhinitis, unspecified: Secondary | ICD-10-CM | POA: Diagnosis not present

## 2023-04-29 DIAGNOSIS — I10 Essential (primary) hypertension: Secondary | ICD-10-CM | POA: Diagnosis not present

## 2023-04-29 DIAGNOSIS — R252 Cramp and spasm: Secondary | ICD-10-CM | POA: Diagnosis not present

## 2023-04-29 DIAGNOSIS — R7303 Prediabetes: Secondary | ICD-10-CM | POA: Diagnosis not present

## 2023-04-29 DIAGNOSIS — Z23 Encounter for immunization: Secondary | ICD-10-CM | POA: Diagnosis not present

## 2023-04-30 ENCOUNTER — Ambulatory Visit: Payer: Medicare PPO | Admitting: Allergy

## 2023-09-01 NOTE — Progress Notes (Deleted)
 72 y.o. G1P1001 Single African American female here for a breast and pelvic exam.    The patient is also followed for ***.  PCP: Tally Joe, MD   No LMP recorded. Patient is postmenopausal.           Sexually active: Yes.    The current method of family planning is post menopausal status.    Menopausal hormone therapy:  n/a Exercising: {yes no:314532}  {types:19826} Smoker:  no  OB History     Gravida  1   Para  1   Term  1   Preterm      AB      Living  1      SAB      IAB      Ectopic      Multiple      Live Births              HEALTH MAINTENANCE: Last 2 paps: 07/09/21 ASCUS: HR HPV neg History of abnormal Pap or positive HPV:  yes Mammogram:  09/03/22 Breast Density Cat B, BI-RADS CAT 2 benign Colonoscopy:  05/08/22 Bone Density:  08/22/20  Result  osteopenia   Immunization History  Administered Date(s) Administered   Influenza, High Dose Seasonal PF 05/15/2019   PFIZER(Purple Top)SARS-COV-2 Vaccination 09/06/2019, 10/07/2019   Tdap 04/23/2013      reports that she has never smoked. She has never been exposed to tobacco smoke. She has never used smokeless tobacco. She reports current alcohol use of about 3.0 standard drinks of alcohol per week. She reports that she does not use drugs.  Past Medical History:  Diagnosis Date   Allergy    Angio-edema    Cervical dysplasia    Eczema    Hypertension     Past Surgical History:  Procedure Laterality Date   BREAST BIOPSY     BENIGN   COLPOSCOPY     GYNECOLOGIC CRYOSURGERY     DR.WALTER HUGHES   TUBAL LIGATION      Current Outpatient Medications  Medication Sig Dispense Refill   amLODipine (NORVASC) 2.5 MG tablet Take 2.5 mg by mouth daily.     aspirin 81 MG tablet Take 81 mg by mouth daily.       azithromycin (ZITHROMAX) 250 MG tablet Take 250 mg by mouth as directed.     Calcium Citrate-Vitamin D (CITRACAL + D PO) Take 1 tablet by mouth daily.     Cholecalciferol (VITAMIN D3 PO)  Take 1,000 Units by mouth daily.     desoximetasone (TOPICORT) 0.25 % cream 1 application     doxycycline (ADOXA) 100 MG tablet Take 100 mg by mouth 2 (two) times daily.     hydrochlorothiazide (HYDRODIURIL) 25 MG tablet Take 1 tablet (25 mg total) by mouth daily. 30 tablet 1   ipratropium (ATROVENT) 0.06 % nasal spray Place 2 sprays into both nostrils 3 (three) times daily. 15 mL 5   levocetirizine (XYZAL) 5 MG tablet Take 1 tablet (5 mg total) by mouth every evening. 30 tablet 5   montelukast (SINGULAIR) 10 MG tablet 1 tablet     Multiple Vitamin (MULTIVITAMIN WITH MINERALS) TABS Take 1 tablet by mouth daily.     OVER THE COUNTER MEDICATION Bonafide Takes 1 tablet bid     Potassium Chloride ER 20 MEQ TBCR Take by mouth.     potassium chloride SA (K-DUR,KLOR-CON) 20 MEQ tablet Take 1 tablet (20 mEq total) by mouth daily. 7 tablet 0   TOBRADEX  ophthalmic ointment Place 1 Application into the left eye every 4 (four) hours while awake.     Current Facility-Administered Medications  Medication Dose Route Frequency Provider Last Rate Last Admin   0.9 %  sodium chloride infusion  500 mL Intravenous Continuous Danis, Starr Lake III, MD        ALLERGIES: Clarithromycin and Sulfa antibiotics  Family History  Problem Relation Age of Onset   Hypertension Mother    Osteoporosis Mother    Colon cancer Mother        late 33's   Heart attack Father 55   Urticaria Sister    Colon polyps Sister    Hypertension Sister    Diabetes Sister    Asthma Brother    Diabetes Brother    Hypertension Maternal Uncle    Colon cancer Maternal Uncle    Prostate cancer Maternal Uncle    Crohn's disease Neg Hx    Esophageal cancer Neg Hx    Rectal cancer Neg Hx    Stomach cancer Neg Hx    Allergic rhinitis Neg Hx    Eczema Neg Hx     Review of Systems  PHYSICAL EXAM:  There were no vitals taken for this visit.    General appearance: alert, cooperative and appears stated age Head: normocephalic, without  obvious abnormality, atraumatic Neck: no adenopathy, supple, symmetrical, trachea midline and thyroid normal to inspection and palpation Lungs: clear to auscultation bilaterally Breasts: normal appearance, no masses or tenderness, No nipple retraction or dimpling, No nipple discharge or bleeding, No axillary adenopathy Heart: regular rate and rhythm Abdomen: soft, non-tender; no masses, no organomegaly Extremities: extremities normal, atraumatic, no cyanosis or edema Skin: skin color, texture, turgor normal. No rashes or lesions Lymph nodes: cervical, supraclavicular, and axillary nodes normal. Neurologic: grossly normal  Pelvic: External genitalia:  no lesions              No abnormal inguinal nodes palpated.              Urethra:  normal appearing urethra with no masses, tenderness or lesions              Bartholins and Skenes: normal                 Vagina: normal appearing vagina with normal color and discharge, no lesions              Cervix: no lesions              Pap taken: {yes no:314532} Bimanual Exam:  Uterus:  normal size, contour, position, consistency, mobility, non-tender              Adnexa: no mass, fullness, tenderness              Rectal exam: {yes no:314532}.  Confirms.              Anus:  normal sphincter tone, no lesions  Chaperone was present for exam:  {BSCHAPERONE:31226::"Brittanni Cariker F, CMA"}  ASSESSMENT: Encounter for breast and pelvic exam.   ***  PLAN: Mammogram screening discussed. Self breast awareness reviewed. Pap and HRV collected:  {yes no:314532} Guidelines for Calcium, Vitamin D, regular exercise program including cardiovascular and weight bearing exercise. Medication refills:  *** {LABS (Optional):23779} Follow up:  ***    Additional counseling given.  {yes [id number]. ***  total time was spent for this patient encounter, including preparation, face-to-face counseling with the patient, coordination of care, and documentation of the encounter in  addition to doing the breast and pelvic exam.

## 2023-09-15 ENCOUNTER — Encounter: Payer: Medicare PPO | Admitting: Obstetrics and Gynecology

## 2023-10-01 DIAGNOSIS — Z1231 Encounter for screening mammogram for malignant neoplasm of breast: Secondary | ICD-10-CM | POA: Diagnosis not present

## 2023-10-07 NOTE — Progress Notes (Deleted)
 73 y.o. G1P1001 Single African American female here for a breast and pelvic exam.    The patient is also followed for ***.  PCP: Tally Joe, MD   No LMP recorded. Patient is postmenopausal.           Sexually active: Yes.    The current method of family planning is post menopausal status.    Menopausal hormone therapy:  n/a Exercising: {yes no:314532}  {types:19826} Smoker:  no  OB History     Gravida  1   Para  1   Term  1   Preterm      AB      Living  1      SAB      IAB      Ectopic      Multiple      Live Births              HEALTH MAINTENANCE: Last 2 paps: 07/09/21 ASCUS: HR HPV neg History of abnormal Pap or positive HPV:  yes Mammogram:  09/03/22 Breast Density Cat B, BI-RADS CAT 2 benign Colonoscopy:  05/08/22 Bone Density:  08/22/20  Result  osteopenia   Immunization History  Administered Date(s) Administered   Influenza, High Dose Seasonal PF 05/15/2019   PFIZER(Purple Top)SARS-COV-2 Vaccination 09/06/2019, 10/07/2019   Tdap 04/23/2013      reports that she has never smoked. She has never been exposed to tobacco smoke. She has never used smokeless tobacco. She reports current alcohol use of about 3.0 standard drinks of alcohol per week. She reports that she does not use drugs.  Past Medical History:  Diagnosis Date   Allergy    Angio-edema    Cervical dysplasia    Eczema    Hypertension     Past Surgical History:  Procedure Laterality Date   BREAST BIOPSY     BENIGN   COLPOSCOPY     GYNECOLOGIC CRYOSURGERY     DR.WALTER HUGHES   TUBAL LIGATION      Current Outpatient Medications  Medication Sig Dispense Refill   amLODipine (NORVASC) 2.5 MG tablet Take 2.5 mg by mouth daily.     aspirin 81 MG tablet Take 81 mg by mouth daily.       azithromycin (ZITHROMAX) 250 MG tablet Take 250 mg by mouth as directed.     Calcium Citrate-Vitamin D (CITRACAL + D PO) Take 1 tablet by mouth daily.     Cholecalciferol (VITAMIN D3 PO)  Take 1,000 Units by mouth daily.     desoximetasone (TOPICORT) 0.25 % cream 1 application     doxycycline (ADOXA) 100 MG tablet Take 100 mg by mouth 2 (two) times daily.     hydrochlorothiazide (HYDRODIURIL) 25 MG tablet Take 1 tablet (25 mg total) by mouth daily. 30 tablet 1   ipratropium (ATROVENT) 0.06 % nasal spray Place 2 sprays into both nostrils 3 (three) times daily. 15 mL 5   levocetirizine (XYZAL) 5 MG tablet Take 1 tablet (5 mg total) by mouth every evening. 30 tablet 5   montelukast (SINGULAIR) 10 MG tablet 1 tablet     Multiple Vitamin (MULTIVITAMIN WITH MINERALS) TABS Take 1 tablet by mouth daily.     OVER THE COUNTER MEDICATION Bonafide Takes 1 tablet bid     Potassium Chloride ER 20 MEQ TBCR Take by mouth.     potassium chloride SA (K-DUR,KLOR-CON) 20 MEQ tablet Take 1 tablet (20 mEq total) by mouth daily. 7 tablet 0   TOBRADEX  ophthalmic ointment Place 1 Application into the left eye every 4 (four) hours while awake.     Current Facility-Administered Medications  Medication Dose Route Frequency Provider Last Rate Last Admin   0.9 %  sodium chloride infusion  500 mL Intravenous Continuous Danis, Starr Lake III, MD        ALLERGIES: Clarithromycin and Sulfa antibiotics  Family History  Problem Relation Age of Onset   Hypertension Mother    Osteoporosis Mother    Colon cancer Mother        late 33's   Heart attack Father 55   Urticaria Sister    Colon polyps Sister    Hypertension Sister    Diabetes Sister    Asthma Brother    Diabetes Brother    Hypertension Maternal Uncle    Colon cancer Maternal Uncle    Prostate cancer Maternal Uncle    Crohn's disease Neg Hx    Esophageal cancer Neg Hx    Rectal cancer Neg Hx    Stomach cancer Neg Hx    Allergic rhinitis Neg Hx    Eczema Neg Hx     Review of Systems  PHYSICAL EXAM:  There were no vitals taken for this visit.    General appearance: alert, cooperative and appears stated age Head: normocephalic, without  obvious abnormality, atraumatic Neck: no adenopathy, supple, symmetrical, trachea midline and thyroid normal to inspection and palpation Lungs: clear to auscultation bilaterally Breasts: normal appearance, no masses or tenderness, No nipple retraction or dimpling, No nipple discharge or bleeding, No axillary adenopathy Heart: regular rate and rhythm Abdomen: soft, non-tender; no masses, no organomegaly Extremities: extremities normal, atraumatic, no cyanosis or edema Skin: skin color, texture, turgor normal. No rashes or lesions Lymph nodes: cervical, supraclavicular, and axillary nodes normal. Neurologic: grossly normal  Pelvic: External genitalia:  no lesions              No abnormal inguinal nodes palpated.              Urethra:  normal appearing urethra with no masses, tenderness or lesions              Bartholins and Skenes: normal                 Vagina: normal appearing vagina with normal color and discharge, no lesions              Cervix: no lesions              Pap taken: {yes no:314532} Bimanual Exam:  Uterus:  normal size, contour, position, consistency, mobility, non-tender              Adnexa: no mass, fullness, tenderness              Rectal exam: {yes no:314532}.  Confirms.              Anus:  normal sphincter tone, no lesions  Chaperone was present for exam:  {BSCHAPERONE:[id number]::"Brittanni Cariker F, CMA"}  ASSESSMENT: Encounter for breast and pelvic exam.   ***  PLAN: Mammogram screening discussed. Self breast awareness reviewed. Pap and HRV collected:  {yes no:314532} Guidelines for Calcium, Vitamin D, regular exercise program including cardiovascular and weight bearing exercise. Medication refills:  *** {LABS (Optional):23779} Follow up:  ***    Additional counseling given.  {yes [id number]. ***  total time was spent for this patient encounter, including preparation, face-to-face counseling with the patient, coordination of care, and documentation of the encounter in  addition to doing the breast and pelvic exam.

## 2023-10-21 ENCOUNTER — Encounter: Payer: Self-pay | Admitting: Obstetrics and Gynecology

## 2023-11-06 ENCOUNTER — Ambulatory Visit (INDEPENDENT_AMBULATORY_CARE_PROVIDER_SITE_OTHER): Admitting: Otolaryngology

## 2023-11-06 VITALS — BP 148/80 | HR 76 | Ht 62.0 in | Wt 145.0 lb

## 2023-11-06 DIAGNOSIS — H903 Sensorineural hearing loss, bilateral: Secondary | ICD-10-CM

## 2023-11-06 DIAGNOSIS — J329 Chronic sinusitis, unspecified: Secondary | ICD-10-CM | POA: Diagnosis not present

## 2023-11-06 DIAGNOSIS — H6123 Impacted cerumen, bilateral: Secondary | ICD-10-CM

## 2023-11-06 DIAGNOSIS — J0101 Acute recurrent maxillary sinusitis: Secondary | ICD-10-CM

## 2023-11-06 MED ORDER — AZITHROMYCIN 250 MG PO TABS: ORAL_TABLET | ORAL | 0 refills | Status: AC

## 2023-11-09 DIAGNOSIS — H6123 Impacted cerumen, bilateral: Secondary | ICD-10-CM | POA: Insufficient documentation

## 2023-11-09 DIAGNOSIS — J0101 Acute recurrent maxillary sinusitis: Secondary | ICD-10-CM | POA: Insufficient documentation

## 2023-11-09 DIAGNOSIS — H903 Sensorineural hearing loss, bilateral: Secondary | ICD-10-CM | POA: Insufficient documentation

## 2023-11-09 NOTE — Progress Notes (Signed)
 Patient ID: Rachel Duffy, female   DOB: 10-31-1950, 73 y.o.   MRN: [id number]  Follow-up: Hearing loss, recurrent cerumen impaction New complaints: Nasal congestion, postnasal drainage, facial pressure  HPI: The patient is a 73 year old female who presents today with a new complaint of facial pressure, nasal drainage, and nasal congestion.  The patient has been symptomatic for several weeks.  She was previously seen for recurrent cerumen impaction and bilateral high-frequency sensorineural hearing loss.  According to the patient, she continues to have hearing difficulty, especially in noisy environments.  She has not noted any significant change in her hearing.  She denies any otalgia or otorrhea.  Over the past few weeks, she has noted increasing facial pressure and nasal congestion.  She denies any fever or visual change.  Exam: General: Communicates without difficulty, well nourished, no acute distress. Head: Normocephalic, no evidence injury, no tenderness, facial buttresses intact without stepoff. Face/sinus: No tenderness to palpation and percussion. Facial movement is normal and symmetric. Eyes: PERRL, EOMI. No scleral icterus, conjunctivae clear. Neuro: CN II exam reveals vision grossly intact.  No nystagmus at any point of gaze. Ears: Auricles well formed without lesions.  Bilateral cerumen impaction.  Nose: External evaluation reveals normal support and skin without lesions.  Dorsum is intact.  Anterior rhinoscopy reveals congested and erythematous mucosa over anterior aspect of inferior turbinates and intact septum.  No purulence noted. Oral:  Oral cavity and oropharynx are intact, symmetric, without erythema or edema.  Mucosa is moist without lesions. Neck: Full range of motion without pain.  There is no significant lymphadenopathy.  No masses palpable.  Thyroid bed within normal limits to palpation.  Parotid glands and submandibular glands equal bilaterally without mass.  Trachea is midline.  Neuro:  CN 2-12 grossly intact.   Procedure: Bilateral cerumen disimpaction Anesthesia: None Description: Under the operating microscope, the cerumen is carefully removed with a combination of cerumen currette, alligator forceps, and suction catheters.  After the cerumen is removed, the TMs are noted to be normal.  No mass, erythema, or lesions. The patient tolerated the procedure well.    Assessment: 1.  Acute sinusitis, with erythematous and edematous nasal mucosa. 2.  Bilateral recurrent cerumen impaction.  After the cerumen disimpaction procedure, both tympanic membranes and middle ear spaces are noted to be normal. 3.  Subjectively stable bilateral high-frequency sensorineural hearing loss.  Plan: 1.  The physical exam findings are reviewed with the patient. 2.  Otomicroscopy with bilateral cerumen disimpaction. 3.  Azithromycin for 5 days. 4.  Continue with Zyrtec and Singulair daily. 5.  The patient will return for reevaluation in 10 months, sooner if needed.

## 2023-11-21 DIAGNOSIS — L309 Dermatitis, unspecified: Secondary | ICD-10-CM | POA: Diagnosis not present

## 2023-11-21 DIAGNOSIS — Z1159 Encounter for screening for other viral diseases: Secondary | ICD-10-CM | POA: Diagnosis not present

## 2023-11-21 DIAGNOSIS — M810 Age-related osteoporosis without current pathological fracture: Secondary | ICD-10-CM | POA: Diagnosis not present

## 2023-11-21 DIAGNOSIS — I10 Essential (primary) hypertension: Secondary | ICD-10-CM | POA: Diagnosis not present

## 2023-11-21 DIAGNOSIS — R7303 Prediabetes: Secondary | ICD-10-CM | POA: Diagnosis not present

## 2023-11-21 DIAGNOSIS — Z0000 Encounter for general adult medical examination without abnormal findings: Secondary | ICD-10-CM | POA: Diagnosis not present

## 2023-11-21 DIAGNOSIS — J309 Allergic rhinitis, unspecified: Secondary | ICD-10-CM | POA: Diagnosis not present

## 2023-11-21 DIAGNOSIS — Z1211 Encounter for screening for malignant neoplasm of colon: Secondary | ICD-10-CM | POA: Diagnosis not present

## 2023-12-30 DIAGNOSIS — I10 Essential (primary) hypertension: Secondary | ICD-10-CM | POA: Diagnosis not present

## 2023-12-30 DIAGNOSIS — L309 Dermatitis, unspecified: Secondary | ICD-10-CM | POA: Diagnosis not present

## 2024-01-15 ENCOUNTER — Ambulatory Visit (INDEPENDENT_AMBULATORY_CARE_PROVIDER_SITE_OTHER): Payer: Medicare PPO

## 2024-02-05 NOTE — Progress Notes (Addendum)
 73 y.o. G1P1001 Single African American female here for a breast and pelvic exam.    The patient is also followed for osteopenia.   Still having hot flashes.  Has used black cohosh.   Not pursuing tx options.   Same partner for many years.   PCP: Rae Bugler, MD   No LMP recorded. Patient is postmenopausal.           Sexually active: Yes.    The current method of family planning is post menopausal status.    Menopausal hormone therapy:  n/a Exercising: Yes.    Walking Smoker:  no  OB History     Gravida  1   Para  1   Term  1   Preterm      AB      Living  1      SAB      IAB      Ectopic      Multiple      Live Births              HEALTH MAINTENANCE: Last 2 paps: 07/09/21 ASCUS, HR HPV neg, 06/21/19 neg History of abnormal Pap or positive HPV:  yes Mammogram:  09/03/22 Breast Density Cat B, BIRADS Cat 2 benign - [facility].   Has appointment.  Colonoscopy:  05/08/22 Bone Density:  08/22/20  Result  osteopenia of spine.  Stopped Actonel  due to dental issues.     Immunization History  Administered Date(s) Administered   Influenza, High Dose Seasonal PF 05/15/2019   PFIZER(Purple Top)SARS-COV-2 Vaccination 09/06/2019, 10/07/2019   Tdap 04/23/2013      reports that she has never smoked. She has never been exposed to tobacco smoke. She has never used smokeless tobacco. She reports current alcohol use of about 3.0 standard drinks of alcohol per week. She reports that she does not use drugs.  Past Medical History:  Diagnosis Date   Allergy    Angio-edema    Cervical dysplasia    Eczema    Hypertension     Past Surgical History:  Procedure Laterality Date   BREAST BIOPSY     BENIGN   COLPOSCOPY     GYNECOLOGIC CRYOSURGERY     DR.WALTER HUGHES   TUBAL LIGATION      Current Outpatient Medications  Medication Sig Dispense Refill   amLODipine (NORVASC) 2.5 MG tablet Take 2.5 mg by mouth daily.     aspirin 81 MG tablet Take 81 mg by mouth  daily.       Calcium Citrate-Vitamin D  (CITRACAL + D PO) Take 1 tablet by mouth daily.     cetirizine (ZYRTEC) 10 MG tablet Take 10 mg by mouth 2 (two) times daily.     Cholecalciferol (VITAMIN D3 PO) Take 1,000 Units by mouth daily.     desoximetasone (TOPICORT) 0.25 % cream 1 application     hydrochlorothiazide  (HYDRODIURIL ) 25 MG tablet Take 1 tablet (25 mg total) by mouth daily. 30 tablet 1   ipratropium (ATROVENT ) 0.06 % nasal spray Place 2 sprays into both nostrils 3 (three) times daily. 15 mL 5   montelukast (SINGULAIR) 10 MG tablet 1 tablet     Multiple Vitamin (MULTIVITAMIN WITH MINERALS) TABS Take 1 tablet by mouth daily.     Multiple Vitamins-Minerals (CENTRUM WOMEN) TABS for Menopause 1 tablet Orally once a day     Potassium Chloride  ER 20 MEQ TBCR Take by mouth.     azithromycin  (ZITHROMAX ) 250 MG tablet Take 250 mg  by mouth as directed. (Patient not taking: Reported on 02/09/2024)     doxycycline (ADOXA) 100 MG tablet Take 100 mg by mouth 2 (two) times daily. (Patient not taking: Reported on 02/09/2024)     levocetirizine (XYZAL ) 5 MG tablet Take 1 tablet (5 mg total) by mouth every evening. (Patient not taking: Reported on 02/09/2024) 30 tablet 5   Current Facility-Administered Medications  Medication Dose Route Frequency Provider Last Rate Last Admin   0.9 %  sodium chloride  infusion  500 mL Intravenous Continuous Danis, Roel Clarity III, MD        ALLERGIES: Clarithromycin and Sulfa antibiotics  Family History  Problem Relation Age of Onset   Hypertension Mother    Osteoporosis Mother    Colon cancer Mother        late 64's   Heart attack Father 73   Urticaria Sister    Colon polyps Sister    Hypertension Sister    Diabetes Sister    Asthma Brother    Diabetes Brother    Hypertension Maternal Uncle    Colon cancer Maternal Uncle    Prostate cancer Maternal Uncle    Crohn's disease Neg Hx    Esophageal cancer Neg Hx    Rectal cancer Neg Hx    Stomach cancer Neg Hx     Allergic rhinitis Neg Hx    Eczema Neg Hx     Review of Systems  All other systems reviewed and are negative.   PHYSICAL EXAM:  BP 132/84 (BP Location: Left Arm, Patient Position: Sitting)   Pulse 74   Ht 5' 2.5 (1.588 m)   Wt 144 lb (65.3 kg)   SpO2 97%   BMI 25.92 kg/m     General appearance: alert, cooperative and appears stated age Head: normocephalic, without obvious abnormality, atraumatic Neck: no adenopathy, supple, symmetrical, trachea midline and thyroid  normal to inspection and palpation Lungs: clear to auscultation bilaterally Breasts: normal appearance, no masses or tenderness, No nipple retraction or dimpling, No nipple discharge or bleeding, No axillary adenopathy Heart: regular rate and rhythm Abdomen: soft, non-tender; no masses, no organomegaly Extremities: extremities normal, atraumatic, no cyanosis or edema Skin: skin color, texture, turgor normal. No rashes or lesions Lymph nodes: cervical, supraclavicular, and axillary nodes normal. Neurologic: grossly normal  Pelvic: External genitalia:  no lesions              No abnormal inguinal nodes palpated.              Urethra:  normal appearing urethra with no masses, tenderness or lesions              Bartholins and Skenes: normal                 Vagina: normal appearing vagina with normal color and discharge, no lesions              Cervix: no lesions              Pap taken: yes Bimanual Exam:  Uterus:  normal size, contour, position, consistency, mobility, non-tender              Adnexa: no mass, fullness, tenderness              Rectal exam: yes.  Confirms.              Anus:  normal sphincter tone, no lesions  Chaperone was present for exam:  Cottie Diss, CMA  ASSESSMENT: Encounter for breast  and pelvic exam.  ASCUS pap, positive HR HPV. Menopausal female.   Vasomotor symptoms.  Hx prior osteoporosis. Osteopenia on BMD from 2021.   PLAN: Mammogram screening discussed. Self breast awareness  reviewed. Pap and HRV collected:  yes Guidelines for Calcium, Vitamin D , regular exercise program including cardiovascular and weight bearing exercise. Medication refills:  NA We discussed gabapentin, Veozah, Paxil , and Effexor  as options to treat vasomotor symptoms.  She declines tx.  [facility] order [facility].   Follow up:  2 years and prn.    Additional counseling given.  yes. 30 min  total time was spent for this patient encounter, including preparation, face-to-face counseling with the patient, coordination of care, and documentation of the encounter in addition to doing the breast and pelvic exam and pap.   ADDENDUM:  07/09/21 PAP ASCUS, NEGATIVE HR HPV.

## 2024-02-09 ENCOUNTER — Ambulatory Visit (INDEPENDENT_AMBULATORY_CARE_PROVIDER_SITE_OTHER): Payer: Medicare PPO | Admitting: Obstetrics and Gynecology

## 2024-02-09 ENCOUNTER — Other Ambulatory Visit (HOSPITAL_COMMUNITY)
Admission: RE | Admit: 2024-02-09 | Discharge: 2024-02-09 | Disposition: A | Discharge status: Home / Self Care | Source: Ambulatory Visit | Attending: Obstetrics and Gynecology | Admitting: Obstetrics and Gynecology

## 2024-02-09 ENCOUNTER — Encounter: Payer: Self-pay | Admitting: Obstetrics and Gynecology

## 2024-02-09 VITALS — BP 132/84 | HR 74 | Ht 62.5 in | Wt 144.0 lb

## 2024-02-09 DIAGNOSIS — Z8742 Personal history of other diseases of the female genital tract: Secondary | ICD-10-CM | POA: Diagnosis present

## 2024-02-09 DIAGNOSIS — Z124 Encounter for screening for malignant neoplasm of cervix: Secondary | ICD-10-CM | POA: Diagnosis present

## 2024-02-09 DIAGNOSIS — M8580 Other specified disorders of bone density and structure, unspecified site: Secondary | ICD-10-CM

## 2024-02-09 DIAGNOSIS — Z780 Asymptomatic menopausal state: Secondary | ICD-10-CM

## 2024-02-09 DIAGNOSIS — Z9189 Other specified personal risk factors, not elsewhere classified: Secondary | ICD-10-CM

## 2024-02-09 DIAGNOSIS — Z1151 Encounter for screening for human papillomavirus (HPV): Secondary | ICD-10-CM | POA: Insufficient documentation

## 2024-02-09 DIAGNOSIS — Z01419 Encounter for gynecological examination (general) (routine) without abnormal findings: Secondary | ICD-10-CM | POA: Diagnosis present

## 2024-02-09 NOTE — Patient Instructions (Signed)

## 2024-02-15 ENCOUNTER — Ambulatory Visit: Payer: Self-pay | Admitting: Obstetrics and Gynecology

## 2024-02-17 DIAGNOSIS — Z780 Asymptomatic menopausal state: Secondary | ICD-10-CM | POA: Diagnosis not present

## 2024-02-17 LAB — HM DEXA SCAN: HM Dexa Scan: NORMAL

## 2024-02-19 ENCOUNTER — Ambulatory Visit: Payer: Self-pay | Admitting: Obstetrics and Gynecology

## 2024-02-19 ENCOUNTER — Encounter: Payer: Self-pay | Admitting: Obstetrics and Gynecology

## 2024-04-16 ENCOUNTER — Other Ambulatory Visit: Payer: Self-pay | Admitting: Allergy

## 2024-05-04 DIAGNOSIS — L818 Other specified disorders of pigmentation: Secondary | ICD-10-CM | POA: Diagnosis not present

## 2024-05-04 DIAGNOSIS — L309 Dermatitis, unspecified: Secondary | ICD-10-CM | POA: Diagnosis not present

## 2024-05-21 DIAGNOSIS — I10 Essential (primary) hypertension: Secondary | ICD-10-CM | POA: Diagnosis not present

## 2024-05-21 DIAGNOSIS — M810 Age-related osteoporosis without current pathological fracture: Secondary | ICD-10-CM | POA: Diagnosis not present

## 2024-05-21 DIAGNOSIS — L309 Dermatitis, unspecified: Secondary | ICD-10-CM | POA: Diagnosis not present

## 2024-05-21 DIAGNOSIS — R7303 Prediabetes: Secondary | ICD-10-CM | POA: Diagnosis not present

## 2024-05-21 DIAGNOSIS — J309 Allergic rhinitis, unspecified: Secondary | ICD-10-CM | POA: Diagnosis not present

## 2024-08-04 ENCOUNTER — Ambulatory Visit: Admitting: Obstetrics and Gynecology

## 2024-08-10 ENCOUNTER — Ambulatory Visit (INDEPENDENT_AMBULATORY_CARE_PROVIDER_SITE_OTHER): Admitting: Otolaryngology
